# Patient Record
Sex: Male | Born: 1950 | Race: Black or African American | Hispanic: No | Marital: Married | Smoking: Never smoker
Health system: Southern US, Community
[De-identification: ages and names within clinical notes are randomized; demographics above are authoritative.]

## PROBLEM LIST (undated history)

## (undated) DIAGNOSIS — C61 Malignant neoplasm of prostate: Secondary | ICD-10-CM

## (undated) DIAGNOSIS — Z973 Presence of spectacles and contact lenses: Secondary | ICD-10-CM

## (undated) DIAGNOSIS — R351 Nocturia: Secondary | ICD-10-CM

## (undated) DIAGNOSIS — G589 Mononeuropathy, unspecified: Secondary | ICD-10-CM

## (undated) DIAGNOSIS — E079 Disorder of thyroid, unspecified: Secondary | ICD-10-CM

## (undated) DIAGNOSIS — E119 Type 2 diabetes mellitus without complications: Secondary | ICD-10-CM

## (undated) DIAGNOSIS — F419 Anxiety disorder, unspecified: Secondary | ICD-10-CM

## (undated) DIAGNOSIS — E039 Hypothyroidism, unspecified: Secondary | ICD-10-CM

## (undated) DIAGNOSIS — I10 Essential (primary) hypertension: Secondary | ICD-10-CM

## (undated) DIAGNOSIS — E782 Mixed hyperlipidemia: Secondary | ICD-10-CM

## (undated) DIAGNOSIS — F411 Generalized anxiety disorder: Secondary | ICD-10-CM

## (undated) HISTORY — PX: NO PAST SURGERIES: SHX2092

---

## 1999-01-02 ENCOUNTER — Encounter: Payer: Self-pay | Admitting: Emergency Medicine

## 1999-01-02 ENCOUNTER — Inpatient Hospital Stay (HOSPITAL_COMMUNITY): Admission: EM | Admit: 1999-01-02 | Discharge: 1999-01-03 | Payer: Self-pay | Admitting: Emergency Medicine

## 2000-11-29 ENCOUNTER — Ambulatory Visit (HOSPITAL_COMMUNITY): Admission: EM | Admit: 2000-11-29 | Discharge: 2000-11-29 | Payer: Self-pay | Admitting: Emergency Medicine

## 2000-11-29 ENCOUNTER — Encounter: Payer: Self-pay | Admitting: Cardiology

## 2004-07-19 ENCOUNTER — Emergency Department (HOSPITAL_COMMUNITY): Admission: EM | Admit: 2004-07-19 | Discharge: 2004-07-19 | Payer: Self-pay | Admitting: Emergency Medicine

## 2012-05-19 ENCOUNTER — Emergency Department (HOSPITAL_COMMUNITY): Payer: BC Managed Care – PPO

## 2012-05-19 ENCOUNTER — Encounter (HOSPITAL_COMMUNITY): Payer: Self-pay

## 2012-05-19 ENCOUNTER — Emergency Department (HOSPITAL_COMMUNITY)
Admission: EM | Admit: 2012-05-19 | Discharge: 2012-05-19 | Disposition: A | Discharge status: Home / Self Care | Payer: BC Managed Care – PPO | Attending: Emergency Medicine | Admitting: Emergency Medicine

## 2012-05-19 DIAGNOSIS — E079 Disorder of thyroid, unspecified: Secondary | ICD-10-CM | POA: Insufficient documentation

## 2012-05-19 DIAGNOSIS — Z87891 Personal history of nicotine dependence: Secondary | ICD-10-CM | POA: Insufficient documentation

## 2012-05-19 DIAGNOSIS — Z79899 Other long term (current) drug therapy: Secondary | ICD-10-CM | POA: Insufficient documentation

## 2012-05-19 DIAGNOSIS — R42 Dizziness and giddiness: Secondary | ICD-10-CM

## 2012-05-19 DIAGNOSIS — R110 Nausea: Secondary | ICD-10-CM | POA: Insufficient documentation

## 2012-05-19 DIAGNOSIS — F411 Generalized anxiety disorder: Secondary | ICD-10-CM | POA: Insufficient documentation

## 2012-05-19 DIAGNOSIS — I10 Essential (primary) hypertension: Secondary | ICD-10-CM | POA: Insufficient documentation

## 2012-05-19 HISTORY — DX: Disorder of thyroid, unspecified: E07.9

## 2012-05-19 HISTORY — DX: Anxiety disorder, unspecified: F41.9

## 2012-05-19 HISTORY — DX: Essential (primary) hypertension: I10

## 2012-05-19 LAB — CBC
Hemoglobin: 14.7 g/dL (ref 13.0–17.0)
MCH: 29.5 pg (ref 26.0–34.0)
MCV: 86.7 fL (ref 78.0–100.0)
RBC: 4.98 MIL/uL (ref 4.22–5.81)

## 2012-05-19 LAB — COMPREHENSIVE METABOLIC PANEL
ALT: 28 U/L (ref 0–53)
CO2: 27 mEq/L (ref 19–32)
Calcium: 9.8 mg/dL (ref 8.4–10.5)
GFR calc Af Amer: 85 mL/min — ABNORMAL LOW (ref >90)
GFR calc non Af Amer: 74 mL/min — ABNORMAL LOW (ref >90)
Glucose, Bld: 116 mg/dL — ABNORMAL HIGH (ref 70–99)
Sodium: 138 mEq/L (ref 135–145)
Total Bilirubin: 0.3 mg/dL (ref 0.3–1.2)

## 2012-05-19 MED ORDER — ONDANSETRON 4 MG PO TBDP: ORAL_TABLET | ORAL | Status: DC

## 2012-05-19 MED ORDER — MECLIZINE HCL 25 MG PO TABS
25.0000 mg | ORAL_TABLET | Freq: Once | ORAL | Status: AC
  Administered 2012-05-19: 25 mg via ORAL
  Filled 2012-05-19: qty 1

## 2012-05-19 MED ORDER — ONDANSETRON HCL 4 MG/2ML IJ SOLN
4.0000 mg | Freq: Once | INTRAMUSCULAR | Status: AC
  Administered 2012-05-19: 4 mg via INTRAVENOUS
  Filled 2012-05-19: qty 2

## 2012-05-19 MED ORDER — MECLIZINE HCL 25 MG PO TABS: 25.0000 mg | ORAL_TABLET | Freq: Four times a day (QID) | ORAL | Status: DC | PRN

## 2012-05-19 MED ORDER — SODIUM CHLORIDE 0.9 % IV SOLN
Freq: Once | INTRAVENOUS | Status: AC
  Administered 2012-05-19: 18:00:00 via INTRAVENOUS

## 2012-05-19 NOTE — ED Notes (Signed)
Flu-like symptoms for  Began day after New years,.  Pt. Is feeling better today and then began vomiting X4  Pt.  Had an anxiety attack.  And also is dizzy.

## 2012-05-19 NOTE — ED Notes (Signed)
Pt A.O. X 4. Reports mild dizziness with movement. Denies N/V. Denies SOB. Vitals stable. Skin warm and dry. NAD.  Verbalized understanding of medications. Verbalized need to follow up it symptoms don't resolve. Wife at bedside. No further questions at this time.

## 2012-05-19 NOTE — ED Provider Notes (Signed)
History     CSN: [id number]  Arrival date & time 05/19/12  1438   First MD Initiated Contact with Patient 05/19/12 1745      Chief Complaint  Patient presents with  . Influenza    (Consider location/radiation/quality/duration/timing/severity/associated sxs/prior treatment) Patient is a 62 y.o. male presenting with flu symptoms. The history is provided by the patient (pt complains of dizziness when moving head today). No language interpreter was used.  Influenza This is a new problem. The current episode started 3 to 5 hours ago. The problem occurs constantly. The problem has not changed since onset.Pertinent negatives include no chest pain, no abdominal pain and no headaches. Nothing aggravates the symptoms. Nothing relieves the symptoms. He has tried nothing for the symptoms. The treatment provided mild relief.    Past Medical History  Diagnosis Date  . Anxiety   . Hypertension   . Thyroid disease     History reviewed. No pertinent past surgical history.  No family history on file.  History  Substance Use Topics  . Smoking status: Former Games developer  . Smokeless tobacco: Not on file  . Alcohol Use: Yes     Comment: occassional      Review of Systems  Constitutional: Negative for fatigue.  HENT: Negative for congestion, sinus pressure and ear discharge.   Eyes: Negative for discharge.  Respiratory: Negative for cough.   Cardiovascular: Negative for chest pain.  Gastrointestinal: Positive for nausea. Negative for abdominal pain and diarrhea.  Genitourinary: Negative for frequency and hematuria.  Musculoskeletal: Negative for back pain.  Skin: Negative for rash.  Neurological: Positive for dizziness. Negative for seizures and headaches.  Hematological: Negative.   Psychiatric/Behavioral: Negative for hallucinations.    Allergies  Review of patient's allergies indicates no known allergies.  Home Medications   Current Outpatient Rx  Name  Route  Sig  Dispense   Refill  . ACETAMINOPHEN 500 MG PO TABS   Oral   Take 1,000 mg by mouth every 6 (six) hours as needed. For fever         . ALPRAZOLAM ER PO   Oral   Take by mouth.         . B COMPLEX-C PO TABS   Oral   Take 1 tablet by mouth daily.         Marland Kitchen SYNTHROID PO   Oral   Take by mouth.         . DIOVAN PO   Oral   Take by mouth.         Marland Kitchen MECLIZINE HCL 25 MG PO TABS   Oral   Take 1 tablet (25 mg total) by mouth every 6 (six) hours as needed for dizziness.   20 tablet   0   . ONDANSETRON 4 MG PO TBDP      4mg  ODT q6 hours prn nausea   20 tablet   0     BP 146/121  Pulse 76  Temp 98.3 F (36.8 C) (Oral)  Resp 18  SpO2 100%  Physical Exam  Constitutional: He is oriented to person, place, and time. He appears well-developed.  HENT:  Head: Normocephalic and atraumatic.  Eyes: Conjunctivae normal and EOM are normal. No scleral icterus.  Neck: Neck supple. No thyromegaly present.  Cardiovascular: Normal rate and regular rhythm.  Exam reveals no gallop and no friction rub.   No murmur heard. Pulmonary/Chest: No stridor. He has no wheezes. He has no rales. He exhibits no tenderness.  Abdominal:  He exhibits no distension. There is no tenderness. There is no rebound.  Musculoskeletal: Normal range of motion. He exhibits no edema.  Lymphadenopathy:    He has no cervical adenopathy.  Neurological: He is oriented to person, place, and time. Coordination normal.       Vertigo symptoms  Skin: No rash noted. No erythema.  Psychiatric: He has a normal mood and affect. His behavior is normal.    ED Course  Procedures (including critical care time)  Labs Reviewed  COMPREHENSIVE METABOLIC PANEL - Abnormal; Notable for the following:    Glucose, Bld 116 (*)     Alkaline Phosphatase 30 (*)     GFR calc non Af Amer 74 (*)     GFR calc Af Amer 85 (*)     All other components within normal limits  CBC   Ct Head Wo Contrast  05/19/2012  *RADIOLOGY REPORT*  Clinical  Data: Dizziness.  Recent diagnosis of influenza.  CT HEAD WITHOUT CONTRAST  Technique:  Contiguous axial images were obtained from the base of the skull through the vertex without contrast.  Comparison: Unenhanced cranial CT 07/19/2004.  Findings: Asymmetry in the lateral ventricles, right larger than left, is unchanged and felt to be developmental.  Ventricles normal in size.  No mass lesion.  No midline shift.  No acute hemorrhage or hematoma.  No extra-axial fluid collections.  No evidence of acute infarction.  No significant interval change.  Opacification of bilateral ethmoid air cells and the left frontal sinus.  Remaining visualized paranasal sinuses, bilateral mastoid air cells, and bilateral middle ear cavities well-aerated.  IMPRESSION:  1.  Normal intracranially. 2.  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   Original Report Authenticated By: Hulan Saas, M.D.      1. Vertigo       MDM          Benny Lennert, MD 05/19/12 [phone]

## 2014-01-30 DIAGNOSIS — Z79899 Other long term (current) drug therapy: Secondary | ICD-10-CM | POA: Diagnosis not present

## 2014-01-30 DIAGNOSIS — E782 Mixed hyperlipidemia: Secondary | ICD-10-CM | POA: Diagnosis not present

## 2014-01-30 DIAGNOSIS — E039 Hypothyroidism, unspecified: Secondary | ICD-10-CM | POA: Diagnosis not present

## 2014-01-30 DIAGNOSIS — Z0000 Encounter for general adult medical examination without abnormal findings: Secondary | ICD-10-CM | POA: Diagnosis not present

## 2014-01-30 DIAGNOSIS — G479 Sleep disorder, unspecified: Secondary | ICD-10-CM | POA: Diagnosis not present

## 2014-01-30 DIAGNOSIS — Z125 Encounter for screening for malignant neoplasm of prostate: Secondary | ICD-10-CM | POA: Diagnosis not present

## 2014-01-30 DIAGNOSIS — F429 Obsessive-compulsive disorder, unspecified: Secondary | ICD-10-CM | POA: Diagnosis not present

## 2014-01-30 DIAGNOSIS — Z1331 Encounter for screening for depression: Secondary | ICD-10-CM | POA: Diagnosis not present

## 2014-01-30 DIAGNOSIS — N529 Male erectile dysfunction, unspecified: Secondary | ICD-10-CM | POA: Diagnosis not present

## 2014-01-30 DIAGNOSIS — Z23 Encounter for immunization: Secondary | ICD-10-CM | POA: Diagnosis not present

## 2014-01-30 DIAGNOSIS — I10 Essential (primary) hypertension: Secondary | ICD-10-CM | POA: Diagnosis not present

## 2014-06-05 DIAGNOSIS — H16223 Keratoconjunctivitis sicca, not specified as Sjogren's, bilateral: Secondary | ICD-10-CM | POA: Diagnosis not present

## 2014-06-05 DIAGNOSIS — H1712 Central corneal opacity, left eye: Secondary | ICD-10-CM | POA: Diagnosis not present

## 2015-04-17 DIAGNOSIS — F429 Obsessive-compulsive disorder, unspecified: Secondary | ICD-10-CM | POA: Diagnosis not present

## 2015-04-17 DIAGNOSIS — Z23 Encounter for immunization: Secondary | ICD-10-CM | POA: Diagnosis not present

## 2015-04-17 DIAGNOSIS — G4700 Insomnia, unspecified: Secondary | ICD-10-CM | POA: Diagnosis not present

## 2015-04-17 DIAGNOSIS — M79643 Pain in unspecified hand: Secondary | ICD-10-CM | POA: Diagnosis not present

## 2015-04-17 DIAGNOSIS — E039 Hypothyroidism, unspecified: Secondary | ICD-10-CM | POA: Diagnosis not present

## 2015-04-17 DIAGNOSIS — I10 Essential (primary) hypertension: Secondary | ICD-10-CM | POA: Diagnosis not present

## 2015-04-17 DIAGNOSIS — Z0001 Encounter for general adult medical examination with abnormal findings: Secondary | ICD-10-CM | POA: Diagnosis not present

## 2015-04-17 DIAGNOSIS — Z79899 Other long term (current) drug therapy: Secondary | ICD-10-CM | POA: Diagnosis not present

## 2015-04-17 DIAGNOSIS — E559 Vitamin D deficiency, unspecified: Secondary | ICD-10-CM | POA: Diagnosis not present

## 2015-04-17 DIAGNOSIS — E782 Mixed hyperlipidemia: Secondary | ICD-10-CM | POA: Diagnosis not present

## 2015-04-17 DIAGNOSIS — Z125 Encounter for screening for malignant neoplasm of prostate: Secondary | ICD-10-CM | POA: Diagnosis not present

## 2015-04-17 DIAGNOSIS — F329 Major depressive disorder, single episode, unspecified: Secondary | ICD-10-CM | POA: Diagnosis not present

## 2015-08-27 DIAGNOSIS — E039 Hypothyroidism, unspecified: Secondary | ICD-10-CM | POA: Diagnosis not present

## 2016-04-21 DIAGNOSIS — I10 Essential (primary) hypertension: Secondary | ICD-10-CM | POA: Diagnosis not present

## 2016-04-21 DIAGNOSIS — Z125 Encounter for screening for malignant neoplasm of prostate: Secondary | ICD-10-CM | POA: Diagnosis not present

## 2016-04-21 DIAGNOSIS — N529 Male erectile dysfunction, unspecified: Secondary | ICD-10-CM | POA: Diagnosis not present

## 2016-04-21 DIAGNOSIS — Z1389 Encounter for screening for other disorder: Secondary | ICD-10-CM | POA: Diagnosis not present

## 2016-04-21 DIAGNOSIS — Z79899 Other long term (current) drug therapy: Secondary | ICD-10-CM | POA: Diagnosis not present

## 2016-04-21 DIAGNOSIS — E782 Mixed hyperlipidemia: Secondary | ICD-10-CM | POA: Diagnosis not present

## 2016-04-21 DIAGNOSIS — G4700 Insomnia, unspecified: Secondary | ICD-10-CM | POA: Diagnosis not present

## 2016-04-21 DIAGNOSIS — Z0000 Encounter for general adult medical examination without abnormal findings: Secondary | ICD-10-CM | POA: Diagnosis not present

## 2016-04-21 DIAGNOSIS — F419 Anxiety disorder, unspecified: Secondary | ICD-10-CM | POA: Diagnosis not present

## 2016-04-21 DIAGNOSIS — F429 Obsessive-compulsive disorder, unspecified: Secondary | ICD-10-CM | POA: Diagnosis not present

## 2016-04-21 DIAGNOSIS — E559 Vitamin D deficiency, unspecified: Secondary | ICD-10-CM | POA: Diagnosis not present

## 2016-04-21 DIAGNOSIS — Z23 Encounter for immunization: Secondary | ICD-10-CM | POA: Diagnosis not present

## 2016-04-21 DIAGNOSIS — F329 Major depressive disorder, single episode, unspecified: Secondary | ICD-10-CM | POA: Diagnosis not present

## 2016-04-21 DIAGNOSIS — E039 Hypothyroidism, unspecified: Secondary | ICD-10-CM | POA: Diagnosis not present

## 2016-07-23 DIAGNOSIS — E038 Other specified hypothyroidism: Secondary | ICD-10-CM | POA: Diagnosis not present

## 2017-05-16 DIAGNOSIS — F419 Anxiety disorder, unspecified: Secondary | ICD-10-CM | POA: Diagnosis not present

## 2017-05-16 DIAGNOSIS — G4700 Insomnia, unspecified: Secondary | ICD-10-CM | POA: Diagnosis not present

## 2017-05-16 DIAGNOSIS — F429 Obsessive-compulsive disorder, unspecified: Secondary | ICD-10-CM | POA: Diagnosis not present

## 2017-05-16 DIAGNOSIS — E039 Hypothyroidism, unspecified: Secondary | ICD-10-CM | POA: Diagnosis not present

## 2017-05-16 DIAGNOSIS — Z125 Encounter for screening for malignant neoplasm of prostate: Secondary | ICD-10-CM | POA: Diagnosis not present

## 2017-05-16 DIAGNOSIS — I10 Essential (primary) hypertension: Secondary | ICD-10-CM | POA: Diagnosis not present

## 2017-05-16 DIAGNOSIS — E782 Mixed hyperlipidemia: Secondary | ICD-10-CM | POA: Diagnosis not present

## 2017-05-16 DIAGNOSIS — N529 Male erectile dysfunction, unspecified: Secondary | ICD-10-CM | POA: Diagnosis not present

## 2017-05-16 DIAGNOSIS — E559 Vitamin D deficiency, unspecified: Secondary | ICD-10-CM | POA: Diagnosis not present

## 2017-05-16 DIAGNOSIS — Z79899 Other long term (current) drug therapy: Secondary | ICD-10-CM | POA: Diagnosis not present

## 2017-05-16 DIAGNOSIS — Z0001 Encounter for general adult medical examination with abnormal findings: Secondary | ICD-10-CM | POA: Diagnosis not present

## 2017-05-16 DIAGNOSIS — Z1389 Encounter for screening for other disorder: Secondary | ICD-10-CM | POA: Diagnosis not present

## 2017-06-14 DIAGNOSIS — E782 Mixed hyperlipidemia: Secondary | ICD-10-CM | POA: Diagnosis not present

## 2017-06-14 DIAGNOSIS — Z23 Encounter for immunization: Secondary | ICD-10-CM | POA: Diagnosis not present

## 2017-06-14 DIAGNOSIS — I10 Essential (primary) hypertension: Secondary | ICD-10-CM | POA: Diagnosis not present

## 2017-06-14 DIAGNOSIS — Z79899 Other long term (current) drug therapy: Secondary | ICD-10-CM | POA: Diagnosis not present

## 2017-06-14 DIAGNOSIS — E039 Hypothyroidism, unspecified: Secondary | ICD-10-CM | POA: Diagnosis not present

## 2017-07-07 ENCOUNTER — Ambulatory Visit (INDEPENDENT_AMBULATORY_CARE_PROVIDER_SITE_OTHER): Payer: Medicare Other

## 2017-07-07 ENCOUNTER — Ambulatory Visit (INDEPENDENT_AMBULATORY_CARE_PROVIDER_SITE_OTHER): Payer: Medicare Other | Admitting: Podiatry

## 2017-07-07 ENCOUNTER — Other Ambulatory Visit: Payer: Self-pay | Admitting: Podiatry

## 2017-07-07 ENCOUNTER — Encounter: Payer: Self-pay | Admitting: Podiatry

## 2017-07-07 DIAGNOSIS — M779 Enthesopathy, unspecified: Secondary | ICD-10-CM

## 2017-07-07 DIAGNOSIS — L84 Corns and callosities: Secondary | ICD-10-CM

## 2017-07-07 DIAGNOSIS — M79672 Pain in left foot: Secondary | ICD-10-CM | POA: Diagnosis not present

## 2017-07-07 DIAGNOSIS — M79671 Pain in right foot: Secondary | ICD-10-CM | POA: Diagnosis not present

## 2017-07-07 MED ORDER — TRIAMCINOLONE ACETONIDE 10 MG/ML IJ SUSP
10.0000 mg | Freq: Once | INTRAMUSCULAR | Status: AC
  Administered 2017-07-07: 10 mg

## 2017-07-07 NOTE — Progress Notes (Signed)
   Subjective:    Patient ID: Stephen Zavala, male    DOB: Oct 07, 1950, 67 y.o.   MRN: [id number]  HPI    Review of Systems  All other systems reviewed and are negative.      Objective:   Physical Exam        Assessment & Plan:

## 2017-07-07 NOTE — Progress Notes (Signed)
Subjective:   Patient ID: Stephen Zavala, male   DOB: 67 y.o.   MRN: [id number]   HPI Patient presents with chronic lesion of the fifth metatarsal head right that is painful when pressed with moderate lesion of the left fifth metatarsal not as sore with fluid buildup and states is been present for around a year.  Does not remember specific injury and patient does not smoke currently and likes to be active   Review of Systems  All other systems reviewed and are negative.       Objective:  Physical Exam  Constitutional: He appears well-developed and well-nourished.  Cardiovascular: Intact distal pulses.  Pulmonary/Chest: Effort normal.  Musculoskeletal: Normal range of motion.  Neurological: He is alert.  Skin: Skin is warm.  Nursing note and vitals reviewed.   Neurovascular status intact muscle strength adequate range of motion within normal limits with patient found to have inflammation and fluid around the fifth metatarsal head right that is painful when pressed with keratotic lesion formation around the metatarsal head.  Moderate lesion on the fifth metatarsal left foot noted and patient was found to have good digital perfusion and well oriented x3     Assessment:  Inflammatory capsulitis with tailor's bunion deformity and corn callus formation fifth metatarsal right over left     Plan:  H&P x-rays reviewed condition discussed at great length.  I do not recommend surgery but it may be necessary in future and today I did go ahead and I injected the plantar capsule 3 mg dexamethasone Kenalog 5 mg Xylocaine and debrided lesion right and left with no iatrogenic bleeding.  Discussed thick bottom shoes and reappoint when symptomatic  X-rays indicate that there is slight reactivity but no indications of abnormal pressure to the fifth metatarsal

## 2017-07-29 DIAGNOSIS — R0789 Other chest pain: Secondary | ICD-10-CM | POA: Diagnosis not present

## 2018-02-17 ENCOUNTER — Ambulatory Visit (INDEPENDENT_AMBULATORY_CARE_PROVIDER_SITE_OTHER): Payer: Medicare Other | Admitting: Podiatry

## 2018-02-17 ENCOUNTER — Encounter: Payer: Self-pay | Admitting: Podiatry

## 2018-02-17 DIAGNOSIS — L84 Corns and callosities: Secondary | ICD-10-CM

## 2018-02-17 DIAGNOSIS — M779 Enthesopathy, unspecified: Secondary | ICD-10-CM | POA: Diagnosis not present

## 2018-02-17 DIAGNOSIS — M79672 Pain in left foot: Secondary | ICD-10-CM

## 2018-02-17 NOTE — Progress Notes (Signed)
Subjective: 67 year old male presents the office today for concerns of the pain callus to the ball of his right foot on the fifth toe area which is painful and he also gets some pain he points on the top on the fifth MPJ.  He states that her last appointment Dr. Paulla Dolly he was feeling great however the pain started come back.  He has had no treatment since he was last seen with Dr. Paulla Dolly he denies any recent injury or trauma denies any swelling.  He has no other concerns. Denies any systemic complaints such as fevers, chills, nausea, vomiting. No acute changes since last appointment, and no other complaints at this time.   Objective: AAO x3, NAD DP/PT pulses palpable bilaterally, CRT less than 3 seconds Mild tailor's bunion deformities present on the right with tenderness palpation along the fifth MPJ dorsally as well as laterally.  Also hyperkeratotic lesion submetatarsal 5 with tenderness palpation.  Upon debridement there is no underlying ulceration, drainage or any clinical signs of infection noted today.  No open lesions or pre-ulcerative lesions.  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  -Discussed a steroid injection he wished to proceed.  After the skin was prepped in sterile fashion a mixture of dexamethasone phosphate 1 cc as well as 1 cc of lidocaine plain was infiltrated into and around the fifth MPJ without any complications. -Today I sharply debrided the hyperkeratotic lesion without any complications or bleeding. -Offloading pads dispensed for tailor's bunion. -Patient encouraged to call the office with any questions, concerns, change in symptoms.   Trula Slade DPM

## 2018-02-23 ENCOUNTER — Ambulatory Visit: Payer: Medicare Other | Admitting: Podiatry

## 2018-05-31 DIAGNOSIS — F329 Major depressive disorder, single episode, unspecified: Secondary | ICD-10-CM | POA: Diagnosis not present

## 2018-05-31 DIAGNOSIS — I10 Essential (primary) hypertension: Secondary | ICD-10-CM | POA: Diagnosis not present

## 2018-05-31 DIAGNOSIS — Z23 Encounter for immunization: Secondary | ICD-10-CM | POA: Diagnosis not present

## 2018-05-31 DIAGNOSIS — E782 Mixed hyperlipidemia: Secondary | ICD-10-CM | POA: Diagnosis not present

## 2018-05-31 DIAGNOSIS — Z0000 Encounter for general adult medical examination without abnormal findings: Secondary | ICD-10-CM | POA: Diagnosis not present

## 2018-05-31 DIAGNOSIS — L820 Inflamed seborrheic keratosis: Secondary | ICD-10-CM | POA: Diagnosis not present

## 2018-05-31 DIAGNOSIS — Z125 Encounter for screening for malignant neoplasm of prostate: Secondary | ICD-10-CM | POA: Diagnosis not present

## 2018-05-31 DIAGNOSIS — E039 Hypothyroidism, unspecified: Secondary | ICD-10-CM | POA: Diagnosis not present

## 2018-05-31 DIAGNOSIS — Z79899 Other long term (current) drug therapy: Secondary | ICD-10-CM | POA: Diagnosis not present

## 2018-05-31 DIAGNOSIS — Z1389 Encounter for screening for other disorder: Secondary | ICD-10-CM | POA: Diagnosis not present

## 2018-07-21 ENCOUNTER — Ambulatory Visit (INDEPENDENT_AMBULATORY_CARE_PROVIDER_SITE_OTHER): Payer: Medicare Other | Admitting: Podiatry

## 2018-07-21 ENCOUNTER — Encounter: Payer: Self-pay | Admitting: Podiatry

## 2018-07-21 DIAGNOSIS — M779 Enthesopathy, unspecified: Secondary | ICD-10-CM

## 2018-07-21 MED ORDER — TRIAMCINOLONE ACETONIDE 10 MG/ML IJ SUSP
10.0000 mg | Freq: Once | INTRAMUSCULAR | Status: AC
  Administered 2018-07-21: 10 mg

## 2018-07-23 NOTE — Progress Notes (Signed)
Subjective:   Patient ID: Stephen Zavala, male   DOB: 68 y.o.   MRN: [id number]   HPI Patient presents stating he is developed pain in the plantar aspect of the right fifth MPJ and that is sore currently   ROS      Objective:  Physical Exam  Discomfort in the plantar fifth MPJ right with fluid buildup noted of several weeks duration     Assessment:  Inflammatory capsulitis noted fifth MPJ right     Plan:  H&P condition reviewed and today I did sterile prep and injected the plantar capsule 3 mg Dexasone Kenalog 5 mg Xylocaine advised on reduced activity and reappoint to recheck

## 2018-07-31 DIAGNOSIS — E782 Mixed hyperlipidemia: Secondary | ICD-10-CM | POA: Diagnosis not present

## 2019-02-05 ENCOUNTER — Other Ambulatory Visit: Payer: Self-pay

## 2019-02-05 ENCOUNTER — Encounter (HOSPITAL_COMMUNITY): Payer: Self-pay | Admitting: Emergency Medicine

## 2019-02-05 ENCOUNTER — Emergency Department (HOSPITAL_COMMUNITY)
Admission: EM | Admit: 2019-02-05 | Discharge: 2019-02-06 | Disposition: A | Discharge status: Home / Self Care | Payer: Medicare Other | Attending: Emergency Medicine | Admitting: Emergency Medicine

## 2019-02-05 DIAGNOSIS — Z87891 Personal history of nicotine dependence: Secondary | ICD-10-CM | POA: Diagnosis not present

## 2019-02-05 DIAGNOSIS — Z23 Encounter for immunization: Secondary | ICD-10-CM | POA: Insufficient documentation

## 2019-02-05 DIAGNOSIS — Y92003 Bedroom of unspecified non-institutional (private) residence as the place of occurrence of the external cause: Secondary | ICD-10-CM | POA: Insufficient documentation

## 2019-02-05 DIAGNOSIS — W010XXA Fall on same level from slipping, tripping and stumbling without subsequent striking against object, initial encounter: Secondary | ICD-10-CM | POA: Insufficient documentation

## 2019-02-05 DIAGNOSIS — Z79899 Other long term (current) drug therapy: Secondary | ICD-10-CM | POA: Insufficient documentation

## 2019-02-05 DIAGNOSIS — S0231XA Fracture of orbital floor, right side, initial encounter for closed fracture: Secondary | ICD-10-CM | POA: Diagnosis not present

## 2019-02-05 DIAGNOSIS — S0990XA Unspecified injury of head, initial encounter: Secondary | ICD-10-CM | POA: Diagnosis not present

## 2019-02-05 DIAGNOSIS — S0285XA Fracture of orbit, unspecified, initial encounter for closed fracture: Secondary | ICD-10-CM

## 2019-02-05 DIAGNOSIS — I10 Essential (primary) hypertension: Secondary | ICD-10-CM | POA: Diagnosis not present

## 2019-02-05 DIAGNOSIS — W19XXXA Unspecified fall, initial encounter: Secondary | ICD-10-CM

## 2019-02-05 DIAGNOSIS — S0083XA Contusion of other part of head, initial encounter: Secondary | ICD-10-CM

## 2019-02-05 DIAGNOSIS — Y999 Unspecified external cause status: Secondary | ICD-10-CM | POA: Insufficient documentation

## 2019-02-05 DIAGNOSIS — Y9389 Activity, other specified: Secondary | ICD-10-CM | POA: Diagnosis not present

## 2019-02-05 DIAGNOSIS — S01112A Laceration without foreign body of left eyelid and periocular area, initial encounter: Secondary | ICD-10-CM | POA: Diagnosis not present

## 2019-02-05 DIAGNOSIS — S01111A Laceration without foreign body of right eyelid and periocular area, initial encounter: Secondary | ICD-10-CM | POA: Diagnosis not present

## 2019-02-05 DIAGNOSIS — S199XXA Unspecified injury of neck, initial encounter: Secondary | ICD-10-CM | POA: Diagnosis not present

## 2019-02-05 NOTE — ED Triage Notes (Addendum)
Pt reports mechanical fall tonight. Right eye swollen, red, laceration above the right eye. Denies vision changed, A&O x 4, bleeding controlled at this time. Pt denies blood thinner use.

## 2019-02-06 ENCOUNTER — Emergency Department (HOSPITAL_COMMUNITY): Payer: Medicare Other

## 2019-02-06 DIAGNOSIS — S01111A Laceration without foreign body of right eyelid and periocular area, initial encounter: Secondary | ICD-10-CM | POA: Diagnosis not present

## 2019-02-06 DIAGNOSIS — S0231XA Fracture of orbital floor, right side, initial encounter for closed fracture: Secondary | ICD-10-CM | POA: Diagnosis not present

## 2019-02-06 DIAGNOSIS — S199XXA Unspecified injury of neck, initial encounter: Secondary | ICD-10-CM | POA: Diagnosis not present

## 2019-02-06 DIAGNOSIS — S0990XA Unspecified injury of head, initial encounter: Secondary | ICD-10-CM | POA: Diagnosis not present

## 2019-02-06 MED ORDER — TETANUS-DIPHTH-ACELL PERTUSSIS 5-2.5-18.5 LF-MCG/0.5 IM SUSP
0.5000 mL | Freq: Once | INTRAMUSCULAR | Status: AC
  Administered 2019-02-06: 0.5 mL via INTRAMUSCULAR
  Filled 2019-02-06: qty 0.5

## 2019-02-06 MED ORDER — LIDOCAINE HCL (PF) 1 % IJ SOLN
10.0000 mL | Freq: Once | INTRAMUSCULAR | Status: AC
  Administered 2019-02-06: 07:00:00 10 mL
  Filled 2019-02-06: qty 10

## 2019-02-06 MED ORDER — ACETAMINOPHEN 325 MG PO TABS
650.0000 mg | ORAL_TABLET | Freq: Once | ORAL | Status: AC
  Administered 2019-02-06: 650 mg via ORAL
  Filled 2019-02-06: qty 2

## 2019-02-06 MED ORDER — TRAMADOL HCL 50 MG PO TABS: 50.0000 mg | ORAL_TABLET | Freq: Two times a day (BID) | ORAL | 0 refills | Status: DC | PRN

## 2019-02-06 NOTE — ED Notes (Signed)
Patient verbalizes understanding of discharge instructions. Opportunity for questioning and answers were provided. Armband removed by staff, pt discharged from ED.  

## 2019-02-06 NOTE — Discharge Instructions (Addendum)
Your diagnosis today was a orbital p fracture.  Please follow-up with Dr. Trilby Drummer ear nose and throat doctor--within the next 1 to 2 weeks for reassessment.  Please do not allow your forehead laceration to get wet in the next 24 hours.  The sutures will dissolve within 2 to 3 weeks.  You may see a scar form.  Apply A&E ointment or Vaseline to area to keep moist and promote healing.   Keep the laceration site dry for the next 24 hours and leave the dressing in place. After 24 hours you may remove the dressing and gently clean the laceration site with antibacterial soap and warm water. Do not scrub the area. Do not soak the area and water for long periods of time. Scalp laceration generally heal very well with minimal risk of infection, however, you should return sooner for any signs of infection which would include increased redness around the wound, increased swelling, new drainage of yellow pus.

## 2019-02-06 NOTE — ED Provider Notes (Signed)
[facility] EMERGENCY DEPARTMENT Provider Note   CSN: [id number] Arrival date & time: 02/05/19  2004     History   Chief Complaint Chief Complaint  Patient presents with   Fall   Facial Laceration    HPI Stephen Zavala is a 68 y.o. male with history of hypertension and hypothyroidism presents for right forehead laceration after falling and striking his head upon the ground.  Patient states that he was lying in bed and tripped on some saliva while getting up in the process tripped and fell.  Wife of patient at bedside states that patient lost consciousness after striking his head on the ground was unconscious for approximately 60 seconds.   Upon regaining consciousness patient states that he recalls events leading up to and events after losing consciousness.  Patient denies any weakness, trouble swallowing/talking, paresthesias. Patient denies any vision loss as well. Patient states he is able to ambulate and denies dizziness.   Patient is not anticoagulated, no history of prior episodes of syncope.       HPI  Past Medical History:  Diagnosis Date   Anxiety    Hypertension    Thyroid disease     There are no active problems to display for this patient.   History reviewed. No pertinent surgical history.      Home Medications    Prior to Admission medications   Medication Sig Start Date End Date Taking? Authorizing Provider  acetaminophen (TYLENOL) 500 MG tablet Take 1,000 mg by mouth every 6 (six) hours as needed. For fever    [provider]  ALPRAZolam (XANAX) 0.5 MG tablet TK 1 T PO QD PRF ANXIETY 02/14/18   [provider]  ALPRAZOLAM ER PO Take by mouth.    [provider]  B Complex-C (B-COMPLEX WITH VITAMIN C) tablet Take 1 tablet by mouth daily.    [provider]  levothyroxine (SYNTHROID, LEVOTHROID) 150 MCG tablet TK 1 T PO QD 05/15/18   [provider]  Levothyroxine Sodium (SYNTHROID PO)  Take by mouth.    [provider]  meclizine (ANTIVERT) 25 MG tablet Take 1 tablet (25 mg total) by mouth every 6 (six) hours as needed for dizziness. 05/19/12   Milton Ferguson, MD  ondansetron (ZOFRAN ODT) 4 MG disintegrating tablet 4mg  ODT q6 hours prn nausea 05/19/12   Milton Ferguson, MD  rosuvastatin (CRESTOR) 20 MG tablet TK 1 T PO QD 06/26/18   [provider]  Valsartan (DIOVAN PO) Take by mouth.    [provider]  valsartan-hydrochlorothiazide (DIOVAN-HCT) 160-12.5 MG tablet TK 1 T PO QD 05/15/18   [provider]  zolpidem (AMBIEN) 10 MG tablet TK 1 T PO HS PRN 05/16/18   [provider]    Family History No family history on file.  Social History Social History   Tobacco Use   Smoking status: Former Smoker   Smokeless tobacco: Never Used  Substance Use Topics   Alcohol use: Yes    Comment: occassional   Drug use: No     Allergies   Statins   Review of Systems Review of Systems  Constitutional: Negative for chills and fever.  HENT: Negative for congestion.   Eyes: Negative for pain.  Respiratory: Negative for cough and shortness of breath.   Cardiovascular: Negative for chest pain and leg swelling.  Gastrointestinal: Negative for abdominal pain and vomiting.  Genitourinary: Negative for dysuria.  Musculoskeletal: Negative for myalgias.  Skin: Positive for wound. Negative for  rash.  Neurological: Negative for dizziness and headaches.     Physical Exam Updated Vital Signs BP (!) 151/86 (BP Location: Right Arm)    Pulse 63    Temp 98.2 F (36.8 C) (Oral)    Resp 12    SpO2 97%   Physical Exam Vitals signs and nursing note reviewed.  Constitutional:      General: He is not in acute distress. HENT:     Head: Normocephalic and atraumatic.     Right Ear: Tympanic membrane, ear canal and external ear normal.     Left Ear: Tympanic membrane, ear canal and external ear normal.     Ears:     Comments: No  peri-auricular bruising, no hemotympanum     Nose: Nose normal.     Mouth/Throat:     Mouth: Mucous membranes are moist.  Eyes:     General: No scleral icterus.    Extraocular Movements: Extraocular movements intact.     Pupils: Pupils are equal, round, and reactive to light.     Comments: EOMI without pain or entrapment  Right eye with subconjunctival hemorrhage and ecchymosis around his eye   Neck:     Musculoskeletal: Normal range of motion.  Cardiovascular:     Rate and Rhythm: Normal rate and regular rhythm.     Pulses: Normal pulses.     Heart sounds: Normal heart sounds.  Pulmonary:     Effort: Pulmonary effort is normal. No respiratory distress.     Breath sounds: No wheezing.  Abdominal:     Palpations: Abdomen is soft.     Tenderness: There is no abdominal tenderness.  Musculoskeletal: Normal range of motion.     Right lower leg: No edema.     Left lower leg: No edema.  Skin:    General: Skin is warm and dry.     Capillary Refill: Capillary refill takes less than 2 seconds.     Findings: Bruising (right eye bruising and swelling) present.     Comments: Laceration of the right eyebrow approximately 3 cm in length that is superficial, gaping, linear and clean without obvious foreign body on initial inspection   Neurological:     Mental Status: He is alert. Mental status is at baseline.     Comments: Alert and oriented to self, place, time and event.  Speech is fluent, clear without dysarthria or dysphasia. Strength 5/5 in upper/lower extremities  Sensation intact in upper/lower extremities  Normal finger-to-nose and feet tapping.  CN I not tested  CN II grossly intact visual fields bilaterally. Patient is not wearing glasses.  CN III, IV, VI PERRLA and EOMs intact bilaterally  CN V Intact sensation to sharp and light touch to the face  CN VII facial movements symmetric  CN VIII not tested  CN IX, X no uvula deviation, symmetric rise of soft palate  CN XI 5/5 SCM  and trapezius strength bilaterally  CN XII Midline tongue protrusion, symmetric L/R movements   Psychiatric:        Mood and Affect: Mood normal.        Behavior: Behavior normal.        ED Treatments / Results  Labs (all labs ordered are listed, but only abnormal results are displayed) Labs Reviewed - No data to display  EKG None  Radiology Ct Head Wo Contrast  Result Date: 02/06/2019 CLINICAL DATA:  Fall from bed.  Right eye swelling. EXAM: CT HEAD WITHOUT CONTRAST CT MAXILLOFACIAL WITHOUT CONTRAST CT  CERVICAL SPINE WITHOUT CONTRAST TECHNIQUE: Multidetector CT imaging of the head, cervical spine, and maxillofacial structures were performed using the standard protocol without intravenous contrast. Multiplanar CT image reconstructions of the cervical spine and maxillofacial structures were also generated. COMPARISON:  Head CT 05/19/2012 FINDINGS: CT HEAD FINDINGS Brain: No evidence of acute infarction, hemorrhage, hydrocephalus, extra-axial collection or mass lesion/mass effect. Vascular: No hyperdense vessel or unexpected calcification. Skull: Negative for calvarial fracture. CT MAXILLOFACIAL FINDINGS Osseous: Right orbital floor blow-out fracture with trace fat herniation. Symmetric normal size inferior rectus. Orbits: As above.  No evidence of globe injury or hematoma. Sinuses: Minimal right maxillary hemosinus. Soft tissues: Contusion with gas around the right orbit. CT CERVICAL SPINE FINDINGS Alignment: Degenerative reversal of cervical lordosis. No traumatic malalignment. Skull base and vertebrae: No acute fracture. No primary bone lesion or focal pathologic process. Soft tissues and spinal canal: No prevertebral fluid or swelling. No visible canal hematoma. Few areas of soft tissue gas or attributed to degenerative vacuum phenomenon. Disc levels: Generalized disc narrowing and degenerative ridging greatest from C3-4 to C6-7. There is also generalized facet spurring which is milder. Upper  chest: No acute finding. Emphysema and pleural based scarring with calcification. IMPRESSION: 1. Right orbital floor blow-out fracture with trace fat herniation. 2. No evidence of intracranial injury. Negative for cervical spine fracture. Electronically Signed   By: Monte Fantasia M.D.   On: 02/06/2019 05:09   Ct Cervical Spine Wo Contrast  Result Date: 02/06/2019 CLINICAL DATA:  Fall from bed.  Right eye swelling. EXAM: CT HEAD WITHOUT CONTRAST CT MAXILLOFACIAL WITHOUT CONTRAST CT CERVICAL SPINE WITHOUT CONTRAST TECHNIQUE: Multidetector CT imaging of the head, cervical spine, and maxillofacial structures were performed using the standard protocol without intravenous contrast. Multiplanar CT image reconstructions of the cervical spine and maxillofacial structures were also generated. COMPARISON:  Head CT 05/19/2012 FINDINGS: CT HEAD FINDINGS Brain: No evidence of acute infarction, hemorrhage, hydrocephalus, extra-axial collection or mass lesion/mass effect. Vascular: No hyperdense vessel or unexpected calcification. Skull: Negative for calvarial fracture. CT MAXILLOFACIAL FINDINGS Osseous: Right orbital floor blow-out fracture with trace fat herniation. Symmetric normal size inferior rectus. Orbits: As above.  No evidence of globe injury or hematoma. Sinuses: Minimal right maxillary hemosinus. Soft tissues: Contusion with gas around the right orbit. CT CERVICAL SPINE FINDINGS Alignment: Degenerative reversal of cervical lordosis. No traumatic malalignment. Skull base and vertebrae: No acute fracture. No primary bone lesion or focal pathologic process. Soft tissues and spinal canal: No prevertebral fluid or swelling. No visible canal hematoma. Few areas of soft tissue gas or attributed to degenerative vacuum phenomenon. Disc levels: Generalized disc narrowing and degenerative ridging greatest from C3-4 to C6-7. There is also generalized facet spurring which is milder. Upper chest: No acute finding. Emphysema and  pleural based scarring with calcification. IMPRESSION: 1. Right orbital floor blow-out fracture with trace fat herniation. 2. No evidence of intracranial injury. Negative for cervical spine fracture. Electronically Signed   By: Monte Fantasia M.D.   On: 02/06/2019 05:09   Ct Maxillofacial Wo Contrast  Result Date: 02/06/2019 CLINICAL DATA:  Fall from bed.  Right eye swelling. EXAM: CT HEAD WITHOUT CONTRAST CT MAXILLOFACIAL WITHOUT CONTRAST CT CERVICAL SPINE WITHOUT CONTRAST TECHNIQUE: Multidetector CT imaging of the head, cervical spine, and maxillofacial structures were performed using the standard protocol without intravenous contrast. Multiplanar CT image reconstructions of the cervical spine and maxillofacial structures were also generated. COMPARISON:  Head CT 05/19/2012 FINDINGS: CT HEAD FINDINGS Brain: No evidence of acute infarction, hemorrhage,  hydrocephalus, extra-axial collection or mass lesion/mass effect. Vascular: No hyperdense vessel or unexpected calcification. Skull: Negative for calvarial fracture. CT MAXILLOFACIAL FINDINGS Osseous: Right orbital floor blow-out fracture with trace fat herniation. Symmetric normal size inferior rectus. Orbits: As above.  No evidence of globe injury or hematoma. Sinuses: Minimal right maxillary hemosinus. Soft tissues: Contusion with gas around the right orbit. CT CERVICAL SPINE FINDINGS Alignment: Degenerative reversal of cervical lordosis. No traumatic malalignment. Skull base and vertebrae: No acute fracture. No primary bone lesion or focal pathologic process. Soft tissues and spinal canal: No prevertebral fluid or swelling. No visible canal hematoma. Few areas of soft tissue gas or attributed to degenerative vacuum phenomenon. Disc levels: Generalized disc narrowing and degenerative ridging greatest from C3-4 to C6-7. There is also generalized facet spurring which is milder. Upper chest: No acute finding. Emphysema and pleural based scarring with  calcification. IMPRESSION: 1. Right orbital floor blow-out fracture with trace fat herniation. 2. No evidence of intracranial injury. Negative for cervical spine fracture. Electronically Signed   By: Monte Fantasia M.D.   On: 02/06/2019 05:09    Procedures .Marland KitchenLaceration Repair  Date/Time: 02/06/2019 7:54 AM Performed by: Tedd Sias, PA Authorized by: Tedd Sias, PA   Consent:    Consent obtained:  Verbal   Consent given by:  Patient   Risks discussed:  Infection, pain and poor cosmetic result   Alternatives discussed:  No treatment Anesthesia (see MAR for exact dosages):    Anesthesia method:  Local infiltration   Local anesthetic:  Lidocaine 1% WITH epi Laceration details:    Location:  Face   Face location:  R eyebrow   Length (cm):  3   Depth (mm):  0.2 Repair type:    Repair type:  Simple Pre-procedure details:    Preparation:  Patient was prepped and draped in usual sterile fashion Exploration:    Hemostasis achieved with:  Direct pressure   Contaminated: no   Treatment:    Area cleansed with:  Betadine and saline   Amount of cleaning:  Standard   Irrigation solution:  Sterile water   Irrigation volume:  100 ccs   Irrigation method:  Syringe   Visualized foreign bodies/material removed: no   Skin repair:    Repair method:  Sutures and Steri-Strips   Suture size:  5-0   Suture material:  Fast-absorbing gut   Suture technique:  Simple interrupted   Number of sutures:  3   Number of Steri-Strips:  2 Approximation:    Approximation:  Close Post-procedure details:    Dressing:  Antibiotic ointment and non-adherent dressing   Patient tolerance of procedure:  Tolerated well, no immediate complications   (including critical care time)  Medications Ordered in ED Medications - No data to display   Initial Impression / Assessment and Plan / ED Course  I have reviewed the triage vital signs and the nursing notes.  Pertinent labs & imaging results that  were available during my care of the patient were reviewed by me and considered in my medical decision making (see chart for details).       Patient is 68 year old male who fell from standing onto his face earlier today.  Concern for intracranial hemorrhage because of mechanism of injury; patient had CT head, CT maxillofacial and CT cervical spine imaging without contrast.  These images show no intracranial hemorrhage and no cervical spine fracture but revealed a orbital blowout fracture of the right orbital floor with trace fat herniation.  On physical exam patient is able to move eyes in all directions without pain or entrapment.  Patient denies any globe pain and lack of visual symptoms or excessive tearing, or FB sensation makes corneal abrasion unlikely.   Discussed need for follow-up with ENT within 2 weeks.  Discussed avoidance of increasing sinus pressure such as forceful blowing of nose and suppressing sneezes.  Patient understands discharge instructions.   Right eyebrow laceration repaired with dissolvable fast-absorbing gut sutures per patient request as he does not wish to have sutures removed.  Patient understands that this may increase likelihood of scarring.   Patient given tylenol during visit as requested and discharged with instructions to control pain with tylenol and provided with a tramadol prescription in case tylenol is inadequate for pain control.        The patient appears reasonably screened and/or stabilized for discharge and I doubt any other medical condition or other [facility] requiring further screening, evaluation, or treatment in the ED at this time prior to discharge.  Patient is hemodynamically stable, in NAD, and able to ambulate in the ED. Evaluation does not show pathology that would require ongoing emergent intervention or inpatient treatment.  Is been managed with Tylenol during ED visit patient sent with tramadol prescription and states he will use only as  needed if pain worsens. Patient is comfortable with above plan and is stable for discharge at this time. All questions were answered prior to disposition. Results from the ER workup discussed with the patient face to face and all questions answered to the best of my ability. The patient is safe for discharge with strict return precautions.  An After Visit Summary was printed and given to the patient.  Portions of this note were generated with [employer]. Dictation errors may occur despite best attempts at proofreading.   Final Clinical Impressions(s) / ED Diagnoses   Final diagnoses:  None    ED Discharge Orders    None       Tedd Sias, [place] 02/06/19 [id number]    Fatima Blank, MD 02/07/19 [phone]

## 2019-02-13 DIAGNOSIS — S0285XA Fracture of orbit, unspecified, initial encounter for closed fracture: Secondary | ICD-10-CM | POA: Diagnosis not present

## 2019-06-19 DIAGNOSIS — Z1211 Encounter for screening for malignant neoplasm of colon: Secondary | ICD-10-CM | POA: Diagnosis not present

## 2019-06-19 DIAGNOSIS — Z125 Encounter for screening for malignant neoplasm of prostate: Secondary | ICD-10-CM | POA: Diagnosis not present

## 2019-06-19 DIAGNOSIS — E559 Vitamin D deficiency, unspecified: Secondary | ICD-10-CM | POA: Diagnosis not present

## 2019-06-19 DIAGNOSIS — E039 Hypothyroidism, unspecified: Secondary | ICD-10-CM | POA: Diagnosis not present

## 2019-06-19 DIAGNOSIS — Z23 Encounter for immunization: Secondary | ICD-10-CM | POA: Diagnosis not present

## 2019-06-19 DIAGNOSIS — Z1389 Encounter for screening for other disorder: Secondary | ICD-10-CM | POA: Diagnosis not present

## 2019-06-19 DIAGNOSIS — I10 Essential (primary) hypertension: Secondary | ICD-10-CM | POA: Diagnosis not present

## 2019-06-19 DIAGNOSIS — Z0000 Encounter for general adult medical examination without abnormal findings: Secondary | ICD-10-CM | POA: Diagnosis not present

## 2019-06-19 DIAGNOSIS — E782 Mixed hyperlipidemia: Secondary | ICD-10-CM | POA: Diagnosis not present

## 2019-06-19 DIAGNOSIS — Z79899 Other long term (current) drug therapy: Secondary | ICD-10-CM | POA: Diagnosis not present

## 2019-06-19 DIAGNOSIS — Z86010 Personal history of colonic polyps: Secondary | ICD-10-CM | POA: Diagnosis not present

## 2019-07-02 DIAGNOSIS — Z1212 Encounter for screening for malignant neoplasm of rectum: Secondary | ICD-10-CM | POA: Diagnosis not present

## 2019-07-02 DIAGNOSIS — Z1211 Encounter for screening for malignant neoplasm of colon: Secondary | ICD-10-CM | POA: Diagnosis not present

## 2019-08-13 ENCOUNTER — Encounter: Payer: Self-pay | Admitting: Podiatry

## 2019-08-13 ENCOUNTER — Other Ambulatory Visit: Payer: Self-pay

## 2019-08-13 ENCOUNTER — Ambulatory Visit (INDEPENDENT_AMBULATORY_CARE_PROVIDER_SITE_OTHER): Payer: Medicare Other

## 2019-08-13 ENCOUNTER — Ambulatory Visit (INDEPENDENT_AMBULATORY_CARE_PROVIDER_SITE_OTHER): Payer: Medicare Other | Admitting: Podiatry

## 2019-08-13 DIAGNOSIS — M722 Plantar fascial fibromatosis: Secondary | ICD-10-CM

## 2019-08-13 DIAGNOSIS — M778 Other enthesopathies, not elsewhere classified: Secondary | ICD-10-CM

## 2019-08-13 DIAGNOSIS — M779 Enthesopathy, unspecified: Secondary | ICD-10-CM | POA: Diagnosis not present

## 2019-08-15 NOTE — Progress Notes (Signed)
Subjective:   Patient ID: Stephen Zavala, male   DOB: 69 y.o.   MRN: [id number]   HPI Patient presents with pain in the left heel states that it is been sore and that it has been going on for few months and she does not remember specific injury.  No other changes health   ROS      Objective:  Physical Exam  Vascular status intact with patient found to have exquisite discomfort left plantar fascia at the insertion of the tendon into the calcaneus with inflammation and fluid of the medial band     Assessment:  Acute plantar fasciitis left inflammation fluid buildup     Plan:  H&P condition reviewed and today I did sterile prep and injected the fascia 3 mg Kenalog 5 mg Xylocaine applied fascial brace and instructed on supportive shoes physical therapy and reduced activity.  Reappoint to recheck in several weeks  X-ray indicates small spur no indications of stress fracture arthritis

## 2019-08-21 DIAGNOSIS — R972 Elevated prostate specific antigen [PSA]: Secondary | ICD-10-CM | POA: Diagnosis not present

## 2019-08-21 DIAGNOSIS — E782 Mixed hyperlipidemia: Secondary | ICD-10-CM | POA: Diagnosis not present

## 2019-08-21 DIAGNOSIS — I10 Essential (primary) hypertension: Secondary | ICD-10-CM | POA: Diagnosis not present

## 2019-08-21 DIAGNOSIS — N410 Acute prostatitis: Secondary | ICD-10-CM | POA: Diagnosis not present

## 2019-08-21 DIAGNOSIS — E039 Hypothyroidism, unspecified: Secondary | ICD-10-CM | POA: Diagnosis not present

## 2019-09-03 ENCOUNTER — Ambulatory Visit: Payer: Federal, State, Local not specified - PPO | Admitting: Podiatry

## 2019-09-05 ENCOUNTER — Encounter: Payer: Self-pay | Admitting: Podiatry

## 2019-09-05 ENCOUNTER — Ambulatory Visit (INDEPENDENT_AMBULATORY_CARE_PROVIDER_SITE_OTHER): Payer: Medicare Other | Admitting: Podiatry

## 2019-09-05 ENCOUNTER — Other Ambulatory Visit: Payer: Self-pay

## 2019-09-05 DIAGNOSIS — M722 Plantar fascial fibromatosis: Secondary | ICD-10-CM | POA: Diagnosis not present

## 2019-09-05 NOTE — Progress Notes (Signed)
Subjective:   Patient ID: Stephen Zavala, male   DOB: 69 y.o.   MRN: [id number]   HPI Patient states that foot is feeling somewhat better but still having some pain in the heel   ROS      Objective:  Physical Exam  Neurovascular status intact with exquisite discomfort plantar fascial left at the insertional point tendon calcaneus     Assessment:  Plantar fasciitis left improved present     Plan:  Sterile prep reinjected the fascia 3 mg Kenalog 5 mg Xylocaine which was tolerated well

## 2019-10-30 DIAGNOSIS — E782 Mixed hyperlipidemia: Secondary | ICD-10-CM | POA: Diagnosis not present

## 2019-10-30 DIAGNOSIS — G4700 Insomnia, unspecified: Secondary | ICD-10-CM | POA: Diagnosis not present

## 2019-10-30 DIAGNOSIS — I10 Essential (primary) hypertension: Secondary | ICD-10-CM | POA: Diagnosis not present

## 2019-10-30 DIAGNOSIS — E039 Hypothyroidism, unspecified: Secondary | ICD-10-CM | POA: Diagnosis not present

## 2019-10-30 DIAGNOSIS — F329 Major depressive disorder, single episode, unspecified: Secondary | ICD-10-CM | POA: Diagnosis not present

## 2019-12-18 DIAGNOSIS — Z23 Encounter for immunization: Secondary | ICD-10-CM | POA: Diagnosis not present

## 2020-01-15 DIAGNOSIS — E782 Mixed hyperlipidemia: Secondary | ICD-10-CM | POA: Diagnosis not present

## 2020-01-15 DIAGNOSIS — F329 Major depressive disorder, single episode, unspecified: Secondary | ICD-10-CM | POA: Diagnosis not present

## 2020-01-15 DIAGNOSIS — I10 Essential (primary) hypertension: Secondary | ICD-10-CM | POA: Diagnosis not present

## 2020-01-15 DIAGNOSIS — E039 Hypothyroidism, unspecified: Secondary | ICD-10-CM | POA: Diagnosis not present

## 2020-01-15 DIAGNOSIS — G4700 Insomnia, unspecified: Secondary | ICD-10-CM | POA: Diagnosis not present

## 2020-01-30 DIAGNOSIS — G4700 Insomnia, unspecified: Secondary | ICD-10-CM | POA: Diagnosis not present

## 2020-01-30 DIAGNOSIS — E782 Mixed hyperlipidemia: Secondary | ICD-10-CM | POA: Diagnosis not present

## 2020-01-30 DIAGNOSIS — I10 Essential (primary) hypertension: Secondary | ICD-10-CM | POA: Diagnosis not present

## 2020-01-30 DIAGNOSIS — F329 Major depressive disorder, single episode, unspecified: Secondary | ICD-10-CM | POA: Diagnosis not present

## 2020-01-30 DIAGNOSIS — E039 Hypothyroidism, unspecified: Secondary | ICD-10-CM | POA: Diagnosis not present

## 2020-07-02 DIAGNOSIS — F329 Major depressive disorder, single episode, unspecified: Secondary | ICD-10-CM | POA: Diagnosis not present

## 2020-07-02 DIAGNOSIS — I10 Essential (primary) hypertension: Secondary | ICD-10-CM | POA: Diagnosis not present

## 2020-07-02 DIAGNOSIS — E039 Hypothyroidism, unspecified: Secondary | ICD-10-CM | POA: Diagnosis not present

## 2020-07-02 DIAGNOSIS — G4700 Insomnia, unspecified: Secondary | ICD-10-CM | POA: Diagnosis not present

## 2020-07-02 DIAGNOSIS — E782 Mixed hyperlipidemia: Secondary | ICD-10-CM | POA: Diagnosis not present

## 2020-07-16 DIAGNOSIS — G4700 Insomnia, unspecified: Secondary | ICD-10-CM | POA: Diagnosis not present

## 2020-07-16 DIAGNOSIS — F329 Major depressive disorder, single episode, unspecified: Secondary | ICD-10-CM | POA: Diagnosis not present

## 2020-07-16 DIAGNOSIS — E039 Hypothyroidism, unspecified: Secondary | ICD-10-CM | POA: Diagnosis not present

## 2020-07-16 DIAGNOSIS — E782 Mixed hyperlipidemia: Secondary | ICD-10-CM | POA: Diagnosis not present

## 2020-07-16 DIAGNOSIS — I10 Essential (primary) hypertension: Secondary | ICD-10-CM | POA: Diagnosis not present

## 2020-08-21 DIAGNOSIS — I10 Essential (primary) hypertension: Secondary | ICD-10-CM | POA: Diagnosis not present

## 2020-08-21 DIAGNOSIS — Z0000 Encounter for general adult medical examination without abnormal findings: Secondary | ICD-10-CM | POA: Diagnosis not present

## 2020-08-21 DIAGNOSIS — F419 Anxiety disorder, unspecified: Secondary | ICD-10-CM | POA: Diagnosis not present

## 2020-08-21 DIAGNOSIS — Z1389 Encounter for screening for other disorder: Secondary | ICD-10-CM | POA: Diagnosis not present

## 2020-08-21 DIAGNOSIS — R7309 Other abnormal glucose: Secondary | ICD-10-CM | POA: Diagnosis not present

## 2020-08-21 DIAGNOSIS — Z7189 Other specified counseling: Secondary | ICD-10-CM | POA: Diagnosis not present

## 2020-08-21 DIAGNOSIS — E782 Mixed hyperlipidemia: Secondary | ICD-10-CM | POA: Diagnosis not present

## 2020-08-21 DIAGNOSIS — M5432 Sciatica, left side: Secondary | ICD-10-CM | POA: Diagnosis not present

## 2020-08-21 DIAGNOSIS — G4700 Insomnia, unspecified: Secondary | ICD-10-CM | POA: Diagnosis not present

## 2020-08-21 DIAGNOSIS — R739 Hyperglycemia, unspecified: Secondary | ICD-10-CM | POA: Diagnosis not present

## 2020-08-21 DIAGNOSIS — E039 Hypothyroidism, unspecified: Secondary | ICD-10-CM | POA: Diagnosis not present

## 2020-08-21 DIAGNOSIS — E559 Vitamin D deficiency, unspecified: Secondary | ICD-10-CM | POA: Diagnosis not present

## 2020-09-05 DIAGNOSIS — R3129 Other microscopic hematuria: Secondary | ICD-10-CM | POA: Diagnosis not present

## 2020-10-08 DIAGNOSIS — E782 Mixed hyperlipidemia: Secondary | ICD-10-CM | POA: Diagnosis not present

## 2020-10-08 DIAGNOSIS — I10 Essential (primary) hypertension: Secondary | ICD-10-CM | POA: Diagnosis not present

## 2020-10-08 DIAGNOSIS — G4700 Insomnia, unspecified: Secondary | ICD-10-CM | POA: Diagnosis not present

## 2020-10-08 DIAGNOSIS — F329 Major depressive disorder, single episode, unspecified: Secondary | ICD-10-CM | POA: Diagnosis not present

## 2020-10-08 DIAGNOSIS — E039 Hypothyroidism, unspecified: Secondary | ICD-10-CM | POA: Diagnosis not present

## 2020-11-06 DIAGNOSIS — F329 Major depressive disorder, single episode, unspecified: Secondary | ICD-10-CM | POA: Diagnosis not present

## 2020-11-06 DIAGNOSIS — E782 Mixed hyperlipidemia: Secondary | ICD-10-CM | POA: Diagnosis not present

## 2020-11-06 DIAGNOSIS — G4700 Insomnia, unspecified: Secondary | ICD-10-CM | POA: Diagnosis not present

## 2020-11-06 DIAGNOSIS — I10 Essential (primary) hypertension: Secondary | ICD-10-CM | POA: Diagnosis not present

## 2020-11-06 DIAGNOSIS — E039 Hypothyroidism, unspecified: Secondary | ICD-10-CM | POA: Diagnosis not present

## 2020-12-23 DIAGNOSIS — S60450A Superficial foreign body of right index finger, initial encounter: Secondary | ICD-10-CM | POA: Diagnosis not present

## 2021-01-14 DIAGNOSIS — F329 Major depressive disorder, single episode, unspecified: Secondary | ICD-10-CM | POA: Diagnosis not present

## 2021-01-14 DIAGNOSIS — I10 Essential (primary) hypertension: Secondary | ICD-10-CM | POA: Diagnosis not present

## 2021-01-14 DIAGNOSIS — E782 Mixed hyperlipidemia: Secondary | ICD-10-CM | POA: Diagnosis not present

## 2021-01-14 DIAGNOSIS — G4700 Insomnia, unspecified: Secondary | ICD-10-CM | POA: Diagnosis not present

## 2021-01-14 DIAGNOSIS — E039 Hypothyroidism, unspecified: Secondary | ICD-10-CM | POA: Diagnosis not present

## 2021-04-30 DIAGNOSIS — R7303 Prediabetes: Secondary | ICD-10-CM | POA: Diagnosis not present

## 2021-04-30 DIAGNOSIS — E039 Hypothyroidism, unspecified: Secondary | ICD-10-CM | POA: Diagnosis not present

## 2021-04-30 DIAGNOSIS — R350 Frequency of micturition: Secondary | ICD-10-CM | POA: Diagnosis not present

## 2021-04-30 DIAGNOSIS — M791 Myalgia, unspecified site: Secondary | ICD-10-CM | POA: Diagnosis not present

## 2021-06-02 DIAGNOSIS — R972 Elevated prostate specific antigen [PSA]: Secondary | ICD-10-CM | POA: Diagnosis not present

## 2021-06-02 DIAGNOSIS — F411 Generalized anxiety disorder: Secondary | ICD-10-CM | POA: Diagnosis not present

## 2021-06-02 DIAGNOSIS — R7303 Prediabetes: Secondary | ICD-10-CM | POA: Diagnosis not present

## 2021-06-26 DIAGNOSIS — R972 Elevated prostate specific antigen [PSA]: Secondary | ICD-10-CM | POA: Diagnosis not present

## 2021-07-14 DIAGNOSIS — N402 Nodular prostate without lower urinary tract symptoms: Secondary | ICD-10-CM | POA: Diagnosis not present

## 2021-07-14 DIAGNOSIS — R972 Elevated prostate specific antigen [PSA]: Secondary | ICD-10-CM | POA: Diagnosis not present

## 2021-07-17 DIAGNOSIS — C61 Malignant neoplasm of prostate: Secondary | ICD-10-CM | POA: Diagnosis not present

## 2021-07-17 DIAGNOSIS — R972 Elevated prostate specific antigen [PSA]: Secondary | ICD-10-CM | POA: Diagnosis not present

## 2021-07-24 DIAGNOSIS — C61 Malignant neoplasm of prostate: Secondary | ICD-10-CM | POA: Diagnosis not present

## 2021-07-28 ENCOUNTER — Other Ambulatory Visit: Payer: Self-pay | Admitting: Urology

## 2021-07-28 ENCOUNTER — Other Ambulatory Visit (HOSPITAL_COMMUNITY): Payer: Self-pay | Admitting: Urology

## 2021-07-28 DIAGNOSIS — C61 Malignant neoplasm of prostate: Secondary | ICD-10-CM

## 2021-07-30 DIAGNOSIS — R972 Elevated prostate specific antigen [PSA]: Secondary | ICD-10-CM | POA: Diagnosis not present

## 2021-08-05 DIAGNOSIS — K5730 Diverticulosis of large intestine without perforation or abscess without bleeding: Secondary | ICD-10-CM | POA: Diagnosis not present

## 2021-08-05 DIAGNOSIS — C61 Malignant neoplasm of prostate: Secondary | ICD-10-CM | POA: Diagnosis not present

## 2021-08-05 DIAGNOSIS — N400 Enlarged prostate without lower urinary tract symptoms: Secondary | ICD-10-CM | POA: Diagnosis not present

## 2021-08-05 DIAGNOSIS — K769 Liver disease, unspecified: Secondary | ICD-10-CM | POA: Diagnosis not present

## 2021-08-07 ENCOUNTER — Ambulatory Visit (HOSPITAL_COMMUNITY)
Admission: RE | Admit: 2021-08-07 | Discharge: 2021-08-07 | Disposition: A | Discharge status: Home / Self Care | Payer: Medicare Other | Source: Ambulatory Visit | Attending: Urology | Admitting: Urology

## 2021-08-07 ENCOUNTER — Other Ambulatory Visit (HOSPITAL_COMMUNITY): Payer: Federal, State, Local not specified - PPO

## 2021-08-07 ENCOUNTER — Encounter (HOSPITAL_COMMUNITY)
Admission: RE | Admit: 2021-08-07 | Discharge: 2021-08-07 | Disposition: A | Discharge status: Home / Self Care | Payer: Medicare Other | Source: Ambulatory Visit | Attending: Urology | Admitting: Urology

## 2021-08-07 ENCOUNTER — Other Ambulatory Visit: Payer: Self-pay

## 2021-08-07 DIAGNOSIS — C61 Malignant neoplasm of prostate: Secondary | ICD-10-CM | POA: Diagnosis not present

## 2021-08-07 MED ORDER — TECHNETIUM TC 99M MEDRONATE IV KIT
20.7000 | PACK | Freq: Once | INTRAVENOUS | Status: AC | PRN
  Administered 2021-08-07: 20.7 via INTRAVENOUS

## 2021-08-12 ENCOUNTER — Other Ambulatory Visit: Payer: Self-pay | Admitting: Urology

## 2021-08-12 DIAGNOSIS — K769 Liver disease, unspecified: Secondary | ICD-10-CM

## 2021-08-14 DIAGNOSIS — C61 Malignant neoplasm of prostate: Secondary | ICD-10-CM | POA: Diagnosis not present

## 2021-08-17 NOTE — Progress Notes (Signed)
GU Location of Tumor / Histology: Prostate Ca ? ?If Prostate Cancer, Gleason Score is (4 + 3) and PSA is (6.26 07/24/2021) ? ?Biopsies  ?Dr. Claudia Desanctis ? ? ? ? ?Past/Anticipated interventions by urology, if any:  ? ?Past/Anticipated interventions by medical oncology, if any:  ? ?Weight changes, if any: No ? ?IPSS:  4 ?SHIM:  23 ? ?Bowel/Bladder complaints, if any:  No ? ?Nausea/Vomiting, if any: No ? ?Pain issues, if any:  0/10 ? ?SAFETY ISSUES: ?Prior radiation?  Yes, 40-45 years ago Thyroid cancer ?Pacemaker/ICD? No ?Possible current pregnancy? Male ?Is the patient on methotrexate?  No ? ?Current Complaints / other details:   ?

## 2021-08-18 ENCOUNTER — Ambulatory Visit
Admission: RE | Admit: 2021-08-18 | Discharge: 2021-08-18 | Disposition: A | Discharge status: Home / Self Care | Payer: Medicare Other | Source: Ambulatory Visit | Attending: Radiation Oncology | Admitting: Radiation Oncology

## 2021-08-18 ENCOUNTER — Other Ambulatory Visit: Payer: Self-pay

## 2021-08-18 ENCOUNTER — Encounter: Payer: Self-pay | Admitting: Radiation Oncology

## 2021-08-18 ENCOUNTER — Telehealth: Payer: Self-pay | Admitting: *Deleted

## 2021-08-18 VITALS — BP 137/90 | HR 69 | Temp 97.0°F | Resp 18 | Ht 73.0 in | Wt 176.2 lb

## 2021-08-18 DIAGNOSIS — Z7984 Long term (current) use of oral hypoglycemic drugs: Secondary | ICD-10-CM | POA: Insufficient documentation

## 2021-08-18 DIAGNOSIS — Z191 Hormone sensitive malignancy status: Secondary | ICD-10-CM | POA: Diagnosis not present

## 2021-08-18 DIAGNOSIS — Z79899 Other long term (current) drug therapy: Secondary | ICD-10-CM | POA: Diagnosis not present

## 2021-08-18 DIAGNOSIS — I10 Essential (primary) hypertension: Secondary | ICD-10-CM | POA: Diagnosis not present

## 2021-08-18 DIAGNOSIS — Z85850 Personal history of malignant neoplasm of thyroid: Secondary | ICD-10-CM | POA: Insufficient documentation

## 2021-08-18 DIAGNOSIS — C61 Malignant neoplasm of prostate: Secondary | ICD-10-CM | POA: Insufficient documentation

## 2021-08-18 NOTE — Progress Notes (Signed)
?Radiation Oncology         [phone] ?________________________________ ? ?Initial Outpatient Consultation ? ?Name: Stephen Zavala MRN: [id number]  ?Date: 08/18/2021  DOB: 02-06-1951 ? ?PJ:KDTOI, Stephen Baltimore, MD  Robley Fries, MD  ? ?REFERRING PHYSICIAN: Robley Fries, MD ? ?DIAGNOSIS: 71 y.o. 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 ? ?  ICD-10-CM   ?1. Malignant neoplasm of prostate ([place])  C61   ?  ? ? ?HISTORY OF PRESENT ILLNESS: Stephen Zavala is a 71 y.o. male with a diagnosis of prostate cancer. He was noted to have an elevated PSA of 6.26 on routine labs with his primary care physician, Dr. Inda Merlin on 04/30/21.  Prior PSAs had been in the 3s. Accordingly, he was referred for evaluation in urology by Dr. Claudia Desanctis on 07/14/21,  digital rectal examination was performed at that time revealing a 5 mm left apex prostate nodule.  The patient proceeded to transrectal ultrasound with 12 biopsies of the prostate on 07/17/21.  The prostate volume measured 27 cc.  Out of 12 core biopsies, 4 were positive.  The maximum Gleason score was 4+4, and this was seen in the left mid. Additionally, Gleason 4+3 was seen in the left base lateral (with perineural invasion) and left mid lateral, and Gleason 3+4 in the right mid (small focus). ? ?He underwent staging CT A/P on 08/05/21 showing no lymphadenopathy or other findings suspicious for metastatic disease but there were a few indeterminate liver lesions seen so he is scheduled for further evaluation with abdominal MRI on 08/31/21. Staging bone scan on 08/07/21 showed degenerative changes but no definite evidence of metastatic disease. ? ?The patient reviewed the biopsy results with his urologist and he has kindly been referred today for discussion of potential radiation treatment options. ? ? ?PREVIOUS RADIATION THERAPY: Yes ?40-45 years ago for thyroid cancer ? ?PAST MEDICAL HISTORY:  ?Past Medical History:  ?Diagnosis Date   ? Anxiety   ? Hypertension   ? Thyroid disease   ?   ? ?PAST SURGICAL HISTORY:History reviewed. No pertinent surgical history. ? ?FAMILY HISTORY: History reviewed. No pertinent family history. ? ?SOCIAL HISTORY: He lives in [place] with his wife. His daughter is an ER physician in [place]. ?Social History  ? ?Socioeconomic History  ? Marital status: Married  ?  Spouse name: Not on file  ? Number of children: Not on file  ? Years of education: Not on file  ? Highest education level: Not on file  ?Occupational History  ? Not on file  ?Tobacco Use  ? Smoking status: Former  ? Smokeless tobacco: Never  ?Substance and Sexual Activity  ? Alcohol use: Yes  ?  Comment: occassional  ? Drug use: No  ? Sexual activity: Not on file  ?Other Topics Concern  ? Not on file  ?Social History Narrative  ? Not on file  ? ?Social Determinants of Health  ? ?Financial Resource Strain: Not on file  ?Food Insecurity: Not on file  ?Transportation Needs: Not on file  ?Physical Activity: Not on file  ?Stress: Not on file  ?Social Connections: Not on file  ?Intimate Partner Violence: Not on file  ? ? ?ALLERGIES: Statins ? ?MEDICATIONS:  ?Current Outpatient Medications  ?Medication Sig Dispense Refill  ? acetaminophen (TYLENOL) 500 MG tablet Take 1,000 mg by mouth every 6 (six) hours as needed. For fever    ? ALPRAZolam (XANAX) 0.5 MG tablet TK 1 T PO QD  PRF ANXIETY  1  ? B Complex-C (B-COMPLEX WITH VITAMIN C) tablet Take 1 tablet by mouth daily.    ? BINAXNOW COVID-19 AG HOME TEST KIT TEST AS DIRECTED TODAY    ? levothyroxine (SYNTHROID, LEVOTHROID) 150 MCG tablet TK 1 T PO QD    ? metFORMIN (GLUCOPHAGE) 500 MG tablet Take 250 mg by mouth daily.    ? ONETOUCH VERIO test strip daily.    ? rosuvastatin (CRESTOR) 20 MG tablet TK 1 T PO QD    ? valsartan-hydrochlorothiazide (DIOVAN-HCT) 160-12.5 MG tablet TK 1 T PO QD    ? ?No current facility-administered medications for this encounter.  ? ? ?REVIEW OF SYSTEMS:  On review of systems, the  patient reports that he is doing well overall. He denies any chest pain, shortness of breath, cough, fevers, chills, night sweats, unintended weight changes. He denies any bowel disturbances, and denies abdominal pain, nausea or vomiting. He denies any new musculoskeletal or joint aches or pains. His IPSS was 4, indicating mild urinary symptoms. His SHIM was 23, indicating he does not have erectile dysfunction. A complete review of systems is obtained and is otherwise negative. ? ?  ?PHYSICAL EXAM:  ?Wt Readings from Last 3 Encounters:  ?08/18/21 176 lb 4 oz (79.9 kg)  ? ?Temp Readings from Last 3 Encounters:  ?08/18/21 (!) 97 ?F (36.1 ?C) (Temporal)  ?02/05/19 98.2 ?F (36.8 ?C) (Oral)  ?05/19/12 98.3 ?F (36.8 ?C) (Oral)  ? ?BP Readings from Last 3 Encounters:  ?08/18/21 137/90  ?02/06/19 (!) 145/93  ?05/19/12 140/88  ? ?Pulse Readings from Last 3 Encounters:  ?08/18/21 69  ?02/06/19 67  ?05/19/12 98  ? ?Pain Assessment ?Pain Score: 0-No pain/10 ? ?In general this is a well appearing African American male in no acute distress. He's alert and oriented x4 and appropriate throughout the examination. Cardiopulmonary assessment is negative for acute distress, and he exhibits normal effort.   ? ? ?KPS = 100 ? ?100 - Normal; no complaints; no evidence of disease. ?90   - Able to carry on normal activity; minor signs or symptoms of disease. ?80   - Normal activity with effort; some signs or symptoms of disease. ?72   - Cares for self; unable to carry on normal activity or to do active work. ?60   - Requires occasional assistance, but is able to care for most of his personal needs. ?50   - Requires considerable assistance and frequent medical care. ?16   - Disabled; requires special care and assistance. ?30   - Severely disabled; hospital admission is indicated although death not imminent. ?20   - Very sick; hospital admission necessary; active supportive treatment necessary. ?10   - Moribund; fatal processes progressing  rapidly. ?0     - Dead ? ?Karnofsky DA, Abelmann WH, Craver LS and Burchenal [facility] [phone]) The use of the nitrogen mustards in the palliative treatment of carcinoma: with particular reference to bronchogenic carcinoma Cancer 1 634-56 ? ?LABORATORY DATA:  ?Lab Results  ?Component Value Date  ? WBC 8.4 05/19/2012  ? HGB 14.7 05/19/2012  ? HCT 43.2 05/19/2012  ? MCV 86.7 05/19/2012  ? PLT 234 05/19/2012  ? ?Lab Results  ?Component Value Date  ? NA 138 05/19/2012  ? K 4.0 05/19/2012  ? CL 100 05/19/2012  ? CO2 27 05/19/2012  ? ?Lab Results  ?Component Value Date  ? ALT 28 05/19/2012  ? AST 36 05/19/2012  ? ALKPHOS 30 (L) 05/19/2012  ? BILITOT  0.3 05/19/2012  ? ?  ?RADIOGRAPHY: NM Bone Scan Whole Body ? ?Result Date: 08/10/2021 ?CLINICAL DATA:  Prostate carcinoma EXAM: NUCLEAR MEDICINE WHOLE BODY BONE SCAN TECHNIQUE: Whole body anterior and posterior images were obtained approximately 3 hours after intravenous injection of radiopharmaceutical. RADIOPHARMACEUTICALS:  mCi Technetium-23mMDP IV COMPARISON:  None available. FINDINGS: There is focal increased tracer uptake in the area of right AC joint. There is focal tracer uptake in the right big toe. No definite focal increased tracer uptake is seen in the axial skeleton. IMPRESSION: There are no definite imaging signs of metastatic disease. Small focal uptake in the right AC joint may be due to previous trauma or degenerative change. Small focal uptake in the area of right first metatarsophalangeal joint in the big toe may be due to previous injury or degenerative arthritis. Electronically Signed   By: PElmer PickerM.D.   On: 08/10/2021 16:01   ?   ?IMPRESSION/PLAN: ?1. 71y.o. gentleman with Stage T2a adenocarcinoma of the prostate with Gleason Score of 4+4, and PSA of 6.26. ?We discussed the patient's workup and outlined the nature of prostate cancer in this setting. The patient's T stage, Gleason's score, and PSA put him into the high risk group. Accordingly, he is  eligible for a variety of potential treatment options including prostatectomy or LT-ADT in combination with either 8 weeks of external radiation or 5 weeks of external radiation with an upfront brachytherapy

## 2021-08-18 NOTE — Telephone Encounter (Signed)
CALLED PATIENT TO INFORM OF ADT FOR 08-25-21- ARRIVAL TIME- 3:30 PM @ DR. PACE'S OFFICE @ [facility], LVM FOR A RETURN CALL ?

## 2021-08-25 DIAGNOSIS — Z5111 Encounter for antineoplastic chemotherapy: Secondary | ICD-10-CM | POA: Diagnosis not present

## 2021-08-25 DIAGNOSIS — C61 Malignant neoplasm of prostate: Secondary | ICD-10-CM | POA: Diagnosis not present

## 2021-08-31 ENCOUNTER — Ambulatory Visit
Admission: RE | Admit: 2021-08-31 | Discharge: 2021-08-31 | Disposition: A | Discharge status: Home / Self Care | Payer: Medicare Other | Source: Ambulatory Visit | Attending: Urology | Admitting: Urology

## 2021-08-31 DIAGNOSIS — N281 Cyst of kidney, acquired: Secondary | ICD-10-CM | POA: Diagnosis not present

## 2021-08-31 DIAGNOSIS — K769 Liver disease, unspecified: Secondary | ICD-10-CM

## 2021-08-31 DIAGNOSIS — K7689 Other specified diseases of liver: Secondary | ICD-10-CM | POA: Diagnosis not present

## 2021-08-31 DIAGNOSIS — I700 Atherosclerosis of aorta: Secondary | ICD-10-CM | POA: Diagnosis not present

## 2021-08-31 DIAGNOSIS — D1803 Hemangioma of intra-abdominal structures: Secondary | ICD-10-CM | POA: Diagnosis not present

## 2021-08-31 MED ORDER — GADOBENATE DIMEGLUMINE 529 MG/ML IV SOLN
17.0000 mL | Freq: Once | INTRAVENOUS | Status: AC | PRN
  Administered 2021-08-31: 17 mL via INTRAVENOUS

## 2021-09-03 DIAGNOSIS — F329 Major depressive disorder, single episode, unspecified: Secondary | ICD-10-CM | POA: Diagnosis not present

## 2021-09-03 DIAGNOSIS — R7309 Other abnormal glucose: Secondary | ICD-10-CM | POA: Diagnosis not present

## 2021-09-03 DIAGNOSIS — E559 Vitamin D deficiency, unspecified: Secondary | ICD-10-CM | POA: Diagnosis not present

## 2021-09-03 DIAGNOSIS — C61 Malignant neoplasm of prostate: Secondary | ICD-10-CM | POA: Diagnosis not present

## 2021-09-03 DIAGNOSIS — E782 Mixed hyperlipidemia: Secondary | ICD-10-CM | POA: Diagnosis not present

## 2021-09-03 DIAGNOSIS — E039 Hypothyroidism, unspecified: Secondary | ICD-10-CM | POA: Diagnosis not present

## 2021-09-03 DIAGNOSIS — G4700 Insomnia, unspecified: Secondary | ICD-10-CM | POA: Diagnosis not present

## 2021-09-03 DIAGNOSIS — I10 Essential (primary) hypertension: Secondary | ICD-10-CM | POA: Diagnosis not present

## 2021-09-03 DIAGNOSIS — Z79899 Other long term (current) drug therapy: Secondary | ICD-10-CM | POA: Diagnosis not present

## 2021-09-03 DIAGNOSIS — F322 Major depressive disorder, single episode, severe without psychotic features: Secondary | ICD-10-CM | POA: Diagnosis not present

## 2021-09-03 DIAGNOSIS — R7303 Prediabetes: Secondary | ICD-10-CM | POA: Diagnosis not present

## 2021-09-03 DIAGNOSIS — F419 Anxiety disorder, unspecified: Secondary | ICD-10-CM | POA: Diagnosis not present

## 2021-09-03 DIAGNOSIS — Z0000 Encounter for general adult medical examination without abnormal findings: Secondary | ICD-10-CM | POA: Diagnosis not present

## 2021-09-03 DIAGNOSIS — M5432 Sciatica, left side: Secondary | ICD-10-CM | POA: Diagnosis not present

## 2021-09-03 DIAGNOSIS — Z1211 Encounter for screening for malignant neoplasm of colon: Secondary | ICD-10-CM | POA: Diagnosis not present

## 2021-09-14 ENCOUNTER — Telehealth: Payer: Self-pay | Admitting: *Deleted

## 2021-09-14 ENCOUNTER — Other Ambulatory Visit: Payer: Self-pay | Admitting: Urology

## 2021-09-14 NOTE — Telephone Encounter (Signed)
RETURNED PATIENT'S PHONE CALL, LVM FOR A RETURN CALL 

## 2021-09-30 ENCOUNTER — Telehealth: Payer: Self-pay | Admitting: *Deleted

## 2021-09-30 NOTE — Telephone Encounter (Signed)
CALLED PATIENT TO REMIND OF PRE-SEED APPTS. FOR 10-01-21, LVM FOR A RETURN CALL ?

## 2021-10-01 ENCOUNTER — Other Ambulatory Visit: Payer: Self-pay

## 2021-10-01 ENCOUNTER — Encounter (HOSPITAL_COMMUNITY)
Admission: RE | Admit: 2021-10-01 | Discharge: 2021-10-01 | Disposition: A | Discharge status: Home / Self Care | Payer: Medicare Other | Source: Ambulatory Visit | Attending: Urology | Admitting: Urology

## 2021-10-01 ENCOUNTER — Ambulatory Visit
Admission: RE | Admit: 2021-10-01 | Discharge: 2021-10-01 | Disposition: A | Discharge status: Home / Self Care | Payer: Medicare Other | Source: Ambulatory Visit | Attending: Radiation Oncology | Admitting: Radiation Oncology

## 2021-10-01 ENCOUNTER — Encounter: Payer: Self-pay | Admitting: Urology

## 2021-10-01 ENCOUNTER — Ambulatory Visit
Admission: RE | Admit: 2021-10-01 | Discharge: 2021-10-01 | Disposition: A | Discharge status: Home / Self Care | Payer: Medicare Other | Source: Ambulatory Visit | Attending: Urology | Admitting: Urology

## 2021-10-01 VITALS — Resp 20 | Ht 73.0 in | Wt 178.0 lb

## 2021-10-01 DIAGNOSIS — Z01810 Encounter for preprocedural cardiovascular examination: Secondary | ICD-10-CM | POA: Insufficient documentation

## 2021-10-01 DIAGNOSIS — Z191 Hormone sensitive malignancy status: Secondary | ICD-10-CM | POA: Diagnosis not present

## 2021-10-01 DIAGNOSIS — I440 Atrioventricular block, first degree: Secondary | ICD-10-CM

## 2021-10-01 DIAGNOSIS — C61 Malignant neoplasm of prostate: Secondary | ICD-10-CM | POA: Diagnosis not present

## 2021-10-01 DIAGNOSIS — Z01818 Encounter for other preprocedural examination: Secondary | ICD-10-CM

## 2021-10-01 HISTORY — DX: Atrioventricular block, first degree: I44.0

## 2021-10-01 NOTE — Progress Notes (Signed)
  Radiation Oncology         [phone] ________________________________  Name: Stephen Zavala MRN: [id number]  Date: 10/01/2021  DOB: [birth year]  SIMULATION AND TREATMENT PLANNING NOTE PUBIC ARCH STUDY  JK:KXFGH, [name], MD  [name], MD  DIAGNOSIS: 71 y.o. 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  Oncology History  Malignant neoplasm of prostate ([place])  07/17/2021 Cancer Staging   Staging form: Prostate, AJCC 8th Edition - Clinical stage from 07/17/2021: Stage IIC (cT2a, cN0, cM0, PSA: 6.3, Grade Group: 4) - Signed by Freeman Caldron, PA-C on 08/18/2021 Histopathologic type: Adenocarcinoma, NOS Stage prefix: Initial diagnosis Prostate specific antigen (PSA) range: Less than 10 Gleason primary pattern: 4 Gleason secondary pattern: 4 Gleason score: 8 Histologic grading system: 5 grade system Number of biopsy cores examined: 12 Number of biopsy cores positive: 4 Location of positive needle core biopsies: Both sides    08/18/2021 Initial Diagnosis   Malignant neoplasm of prostate ([place])        ICD-10-CM   1. Malignant neoplasm of prostate ([place])  C61       COMPLEX SIMULATION:  The patient presented today for evaluation for possible prostate seed implant. He was brought to the radiation planning suite and placed supine on the CT couch. A 3-dimensional image study set was obtained in upload to the planning computer. There, on each axial slice, I contoured the prostate gland. Then, using three-dimensional radiation planning tools I reconstructed the prostate in view of the structures from the transperineal needle pathway to assess for possible pubic arch interference. In doing so, I did not appreciate any pubic arch interference. Also, the patient's prostate volume was estimated based on the drawn structure. The volume was 22 cc.  Given the pubic arch appearance and prostate volume, patient remains a good candidate to proceed  with prostate seed implant. Today, he freely provided informed written consent to proceed.    PLAN: The patient will undergo prostate seed implant boost followed by IMRT   ________________________________  Sheral Apley. Tammi Klippel, M.D.

## 2021-10-01 NOTE — Progress Notes (Signed)
Pre-Seed appointment. I verified patient identity and began nursing interview. Patient states "Doing well." No issues reported at this time.  Meaningful use complete. No urinary management medications. Urology appointment- June 5th & 13th, 2023  Resp 20   Ht '6\' 1"'$  (1.854 m)   Wt 178 lb (80.7 kg)   BMI 23.48 kg/m

## 2021-10-16 ENCOUNTER — Encounter (HOSPITAL_BASED_OUTPATIENT_CLINIC_OR_DEPARTMENT_OTHER): Payer: Self-pay | Admitting: Urology

## 2021-10-19 ENCOUNTER — Encounter (HOSPITAL_BASED_OUTPATIENT_CLINIC_OR_DEPARTMENT_OTHER): Payer: Self-pay | Admitting: Urology

## 2021-10-19 DIAGNOSIS — R351 Nocturia: Secondary | ICD-10-CM | POA: Diagnosis not present

## 2021-10-19 DIAGNOSIS — C61 Malignant neoplasm of prostate: Secondary | ICD-10-CM | POA: Diagnosis not present

## 2021-10-19 NOTE — Progress Notes (Signed)
Spoke w/ via phone for pre-op interview--- pt Lab needs dos----  [employer] results------ current ekg in epic/ chart COVID test -----patient states asymptomatic no test needed Arrive at ------- 1100 on 10-27-2021 NPO after MN NO Solid Food.  Clear liquids from MN until--- 1000 Med rec completed Medications to take morning of surgery ----- crestor, synthroid Diabetic medication ----- do not take metformin morning of surgery Patient instructed no nail polish to be worn day of surgery Patient instructed to bring photo id and insurance card day of surgery Patient aware to have Driver (ride ) / caregiver for 24 hours after surgery --wife, ida Patient Special Instructions ----- will do one fleet enema morning of surgery Pre-Op special Istructions ----- n/a Patient verbalized understanding of instructions that were given at this phone interview. Patient denies shortness of breath, chest pain, fever, cough at this phone interview.

## 2021-10-26 ENCOUNTER — Telehealth: Payer: Self-pay | Admitting: *Deleted

## 2021-10-26 NOTE — H&P (Signed)
CC/HPI: cc: Prostate cancer    08/25/2021:71 year old man referred for an elevated PSA of 6.26 found have a palpable left prostate nodule. He underwent prostate biopsy on 07/17/2021 and biopsy results are noted below. He underwent staging imaging which did not show any concern for metastatic disease. There was indeterminate lesions in the liver for which an MRI is scheduled for next week to evaluate. He met with Dr. Tammi Klippel with radiation oncology and has decided to proceed with radiation. He is here today for his first ADT injection.   pre-biopsy PSA 6.26  Gleason 4+4=8 in 1/12 cores left mid  Gleason 4+3=7 in 2/12 cores left base and left mid  Gleason 3+4=7 in 1/12 cores right mid  66yrCSS 85%  PFP 45%, 30% at 5, 10 yrs  OC: 37%  ECE 61%  LNI 17%  SVI 13%   10/19/2021: MRI of the abdomen performed after last office visit was reassuring, benign. Patient also received 655-monthligard at that visit. Here today for preoperative appointment prior to undergoing radioactive seed implant as well as SpaceOAR on 6/13. He will then begin 5 weeks of XRT for treatment of his underlying prostate cancer diagnosis. Overall doing well. He has had some mild increase in fatigue and having some occasional hot flashes since beginning ADT but these are not limiting. He is staying active, trying to exercise on a routine basis. Tolerating a normal diet without nausea or or vomiting. He is having normal daily bowel movements. At baseline he does have some mild irritative and obstructive voiding symptoms but typically feels like he empties his bladder well without limiting or bothersome daytime frequency/urgency. Nocturia stable x2. He has had no interval dysuria or gross hematuria. No interval treatment for UTI or other infectious process. Patient denies any recent fever/chills, nausea/vomiting, chest pain or shortness of breath.     ALLERGIES: Statin Drugs    MEDICATIONS: Metformin Hcl  Alprazolam 0.5 mg tablet   Levothyroxine 150 mcg capsule  Rosuvastatin Calcium 20 mg tablet  Valsartan-Hydrochlorothiazide 160 mg-12.5 mg tablet  Vitamin B12  Vitamin D2 50 mcg (2,000 unit) tablet     GU PSH: Locm 300-399Mg/Ml Iodine,1Ml - 08/05/2021 Prostate Needle Biopsy - 07/17/2021     NON-GU PSH: Surgical Pathology, Gross And Microscopic Examination For Prostate Needle - 07/17/2021     GU PMH: Prostate Cancer - 08/25/2021, - 08/05/2021, - 07/24/2021 Elevated PSA - 07/17/2021, - 07/14/2021 Prostate nodule w/o LUTS - 07/17/2021, - 07/14/2021      PMH Notes: thyroid disease   NON-GU PMH: Hypercholesterolemia Hypertension    FAMILY HISTORY: 1 Daughter - Daughter 1 son - Son   SOCIAL HISTORY: Marital Status: Married Preferred Language: English; Ethnicity: Not Hispanic Or Latino; Race: Black or African American Current Smoking Status: Patient has never smoked.   Tobacco Use Assessment Completed: Used Tobacco in last 30 days? Social Drinker.  Drinks 1 caffeinated drink per day.    REVIEW OF SYSTEMS:    GU Review Male:   Patient reports get up at night to urinate. Patient denies frequent urination, hard to postpone urination, burning/ pain with urination, leakage of urine, stream starts and stops, trouble starting your stream, have to strain to urinate , erection problems, and penile pain.  Gastrointestinal (Upper):   Patient denies vomiting, nausea, and indigestion/ heartburn.  Gastrointestinal (Lower):   Patient denies diarrhea and constipation.  Constitutional:   Patient reports night sweats and fatigue. Patient denies fever and weight loss.  Skin:   Patient denies skin rash/  lesion and itching.  Eyes:   Patient denies blurred vision and double vision.  Ears/ Nose/ Throat:   Patient denies sore throat and sinus problems.  Hematologic/Lymphatic:   Patient denies swollen glands and easy bruising.  Cardiovascular:   Patient denies leg swelling and chest pains.  Respiratory:   Patient denies cough and  shortness of breath.  Endocrine:   Patient denies excessive thirst.  Musculoskeletal:   Patient denies back pain and joint pain.  Neurological:   Patient denies headaches and dizziness.  Psychologic:   Patient denies depression and anxiety.   VITAL SIGNS:      10/19/2021 10:56 AM  BP 146/83 mmHg  Heart Rate 62 /min  Temperature 98.2 F / 36.7 C   MULTI-SYSTEM PHYSICAL EXAMINATION:    Constitutional: Well-nourished. No physical deformities. Normally developed. Good grooming.  Neck: Neck symmetrical, not swollen. Normal tracheal position.  Respiratory: No labored breathing, no use of accessory muscles.   Cardiovascular: Normal temperature, normal extremity pulses, no swelling, no varicosities.  Skin: No paleness, no jaundice, no cyanosis. No lesion, no ulcer, no rash.  Neurologic / Psychiatric: Oriented to time, oriented to place, oriented to person. No depression, no anxiety, no agitation.  Gastrointestinal: No mass, no tenderness, no rigidity, non obese abdomen.  Musculoskeletal: Normal gait and station of head and neck.     Complexity of Data:  Source Of History:  Patient, Family/Caregiver, Medical Record Summary  Lab Test Review:   PSA  Records Review:   Pathology Reports, Previous Doctor Records, Previous Hospital Records, Previous Patient Records  Urine Test Review:   Urinalysis   10/19/21  Urinalysis  Urine Appearance Clear   Urine Color Yellow   Urine Glucose Neg mg/dL  Urine Bilirubin Neg mg/dL  Urine Ketones Neg mg/dL  Urine Specific Gravity 1.010   Urine Blood Neg ery/uL  Urine pH 6.0   Urine Protein Neg mg/dL  Urine Urobilinogen 0.2 mg/dL  Urine Nitrites Neg   Urine Leukocyte Esterase Neg leu/uL   PROCEDURES:          Urinalysis Dipstick Dipstick Cont'd  Color: Yellow Bilirubin: Neg mg/dL  Appearance: Clear Ketones: Neg mg/dL  Specific Gravity: 1.010 Blood: Neg ery/uL  pH: 6.0 Protein: Neg mg/dL  Glucose: Neg mg/dL Urobilinogen: 0.2 mg/dL    Nitrites: Neg     Leukocyte Esterase: Neg leu/uL    ASSESSMENT:      ICD-10 Details  1 GU:   Prostate Cancer - C61 Chronic, Stable  2   Nocturia - R35.1 Chronic, Stable   PLAN:           Orders Labs Urine Culture          Schedule Return Visit/Planned Activity: Keep Scheduled Appointment - Follow up MD, Schedule Surgery          Document Letter(s):  Created for Patient: Clinical Summary         Notes:   All questions answered to the best of my ability regarding the upcoming procedure and expected postoperative course with understanding expressed by the patient and his wife. Urine culture sent today to serve as a baseline. He will proceed with radioactive seed implant and SpaceOAR insertion on 6/13. This will be followed by 5 weeks of XRT a couple of weeks later under the guidance of radiation oncology.

## 2021-10-26 NOTE — Progress Notes (Signed)
Spoke with patient by phone and he has cold symptoms x 2 days, low grade fever and sore throat on 10-25-2021 and home covid test was negative on 10-25-2021, pt call transferred to [facility] to reschedule surgery for 10-27-2021.

## 2021-10-26 NOTE — Telephone Encounter (Signed)
CALLED PATIENT TO REMIND OF PROCEDURE FOR 10-27-21, SPOKE WITH PATIENT AND HE IS AWARE OF THIS PROCEDURE

## 2021-10-27 ENCOUNTER — Ambulatory Visit (HOSPITAL_BASED_OUTPATIENT_CLINIC_OR_DEPARTMENT_OTHER): Admission: RE | Admit: 2021-10-27 | Payer: Medicare Other | Source: Ambulatory Visit | Admitting: Urology

## 2021-10-27 DIAGNOSIS — Z01818 Encounter for other preprocedural examination: Secondary | ICD-10-CM

## 2021-10-27 HISTORY — DX: Presence of spectacles and contact lenses: Z97.3

## 2021-10-27 HISTORY — DX: Malignant neoplasm of prostate: C61

## 2021-10-27 HISTORY — DX: Mixed hyperlipidemia: E78.2

## 2021-10-27 HISTORY — DX: Type 2 diabetes mellitus without complications: E11.9

## 2021-10-27 HISTORY — DX: Nocturia: R35.1

## 2021-10-27 HISTORY — DX: Hypothyroidism, unspecified: E03.9

## 2021-10-27 HISTORY — DX: Generalized anxiety disorder: F41.1

## 2021-10-27 HISTORY — DX: Mononeuropathy, unspecified: G58.9

## 2021-10-27 SURGERY — INSERTION, RADIATION SOURCE, PROSTATE
Anesthesia: Choice

## 2021-10-27 NOTE — Progress Notes (Signed)
RN reviewed status for upcoming brachytherapy for 6/13.  Procedure was cancelled.  RN spoke with patient, patient had to cancel due to flu like symptoms.    RN notified MD's and will help coordinate for patient to get rescheduled.   Patient verbalized understanding and thankfulness.

## 2021-10-29 DIAGNOSIS — M5412 Radiculopathy, cervical region: Secondary | ICD-10-CM | POA: Diagnosis not present

## 2021-10-29 DIAGNOSIS — E039 Hypothyroidism, unspecified: Secondary | ICD-10-CM | POA: Diagnosis not present

## 2021-10-29 DIAGNOSIS — G4700 Insomnia, unspecified: Secondary | ICD-10-CM | POA: Diagnosis not present

## 2021-11-19 ENCOUNTER — Other Ambulatory Visit: Payer: Self-pay

## 2021-11-19 ENCOUNTER — Encounter (HOSPITAL_BASED_OUTPATIENT_CLINIC_OR_DEPARTMENT_OTHER): Payer: Self-pay | Admitting: Urology

## 2021-11-19 NOTE — Progress Notes (Signed)
Spoke w/ via phone for pre-op interview--- Yoe---- CBG, ISTAT               Lab results------ COVID test -----patient states asymptomatic no test needed Arrive at ------- 1200  NPO after MN NO Solid Food.  Clear liquids from MN until--- 1100 am Med rec completed Medications to take morning of surgery ----- Xanax, Synthroid, Crestor Diabetic medication ----- Hold Metformin Patient instructed no nail polish to be worn day of surgery N/A Patient instructed to bring photo id and insurance card day of surgery Yes Patient aware to have Driver (ride ) / caregiver for 24 hours after surgery Yes wife Arek Spadafore [phone] (cell) Patient Special Instructions ----- Pre-Op special Istructions ----- Fleets enema day of surgery Patient verbalized understanding of instructions that were given at this phone interview. Yes Patient denies shortness of breath, chest pain, fever, cough at this phone interview. Yes

## 2021-11-30 ENCOUNTER — Telehealth: Payer: Self-pay | Admitting: *Deleted

## 2021-11-30 NOTE — Telephone Encounter (Signed)
CALLED PATIENT TO REMIND OF PROCEDURE FOR 12/01/21, LVM FOR A RETURN CALL

## 2021-11-30 NOTE — Telephone Encounter (Signed)
RETURNED PATIENT'S PHONE CALL, SPOKE WITH PATIENT. ?

## 2021-12-01 ENCOUNTER — Encounter (HOSPITAL_BASED_OUTPATIENT_CLINIC_OR_DEPARTMENT_OTHER): Payer: Self-pay | Admitting: Urology

## 2021-12-01 ENCOUNTER — Ambulatory Visit (HOSPITAL_COMMUNITY): Payer: Medicare Other

## 2021-12-01 ENCOUNTER — Encounter (HOSPITAL_BASED_OUTPATIENT_CLINIC_OR_DEPARTMENT_OTHER)
Admission: RE | Disposition: A | Discharge status: Home / Self Care | Payer: Self-pay | Source: Home / Self Care | Attending: Urology

## 2021-12-01 ENCOUNTER — Other Ambulatory Visit: Payer: Self-pay

## 2021-12-01 ENCOUNTER — Ambulatory Visit (HOSPITAL_BASED_OUTPATIENT_CLINIC_OR_DEPARTMENT_OTHER): Payer: Medicare Other | Admitting: Anesthesiology

## 2021-12-01 ENCOUNTER — Ambulatory Visit (HOSPITAL_BASED_OUTPATIENT_CLINIC_OR_DEPARTMENT_OTHER)
Admission: RE | Admit: 2021-12-01 | Discharge: 2021-12-01 | Disposition: A | Discharge status: Home / Self Care | Payer: Medicare Other | Attending: Urology | Admitting: Urology

## 2021-12-01 DIAGNOSIS — C61 Malignant neoplasm of prostate: Secondary | ICD-10-CM

## 2021-12-01 DIAGNOSIS — F419 Anxiety disorder, unspecified: Secondary | ICD-10-CM | POA: Insufficient documentation

## 2021-12-01 DIAGNOSIS — E119 Type 2 diabetes mellitus without complications: Secondary | ICD-10-CM | POA: Insufficient documentation

## 2021-12-01 DIAGNOSIS — Z7984 Long term (current) use of oral hypoglycemic drugs: Secondary | ICD-10-CM | POA: Diagnosis not present

## 2021-12-01 DIAGNOSIS — E039 Hypothyroidism, unspecified: Secondary | ICD-10-CM | POA: Diagnosis not present

## 2021-12-01 DIAGNOSIS — I10 Essential (primary) hypertension: Secondary | ICD-10-CM | POA: Insufficient documentation

## 2021-12-01 DIAGNOSIS — Z191 Hormone sensitive malignancy status: Secondary | ICD-10-CM | POA: Diagnosis not present

## 2021-12-01 HISTORY — PX: SPACE OAR INSTILLATION: SHX6769

## 2021-12-01 HISTORY — PX: RADIOACTIVE SEED IMPLANT: SHX5150

## 2021-12-01 HISTORY — PX: CYSTOSCOPY: SHX5120

## 2021-12-01 LAB — GLUCOSE, CAPILLARY: Glucose-Capillary: 76 mg/dL (ref 70–99)

## 2021-12-01 LAB — POCT I-STAT, CHEM 8
BUN: 11 mg/dL (ref 8–23)
Calcium, Ion: 1.31 mmol/L (ref 1.15–1.40)
Chloride: 102 mmol/L (ref 98–111)
Creatinine, Ser: 1.1 mg/dL (ref 0.61–1.24)
Glucose, Bld: 113 mg/dL — ABNORMAL HIGH (ref 70–99)
HCT: 41 % (ref 39.0–52.0)
Hemoglobin: 13.9 g/dL (ref 13.0–17.0)
Potassium: 3.9 mmol/L (ref 3.5–5.1)
Sodium: 141 mmol/L (ref 135–145)
TCO2: 27 mmol/L (ref 22–32)

## 2021-12-01 SURGERY — INSERTION, RADIATION SOURCE, PROSTATE
Anesthesia: General | Site: Prostate

## 2021-12-01 MED ORDER — CEFAZOLIN SODIUM-DEXTROSE 2-4 GM/100ML-% IV SOLN
INTRAVENOUS | Status: AC
  Filled 2021-12-01: qty 100

## 2021-12-01 MED ORDER — PHENYLEPHRINE 80 MCG/ML (10ML) SYRINGE FOR IV PUSH (FOR BLOOD PRESSURE SUPPORT)
PREFILLED_SYRINGE | INTRAVENOUS | Status: DC | PRN
  Administered 2021-12-01: 160 ug via INTRAVENOUS
  Administered 2021-12-01 (×3): 80 ug via INTRAVENOUS

## 2021-12-01 MED ORDER — GLYCOPYRROLATE PF 0.2 MG/ML IJ SOSY
PREFILLED_SYRINGE | INTRAMUSCULAR | Status: AC
  Filled 2021-12-01: qty 1

## 2021-12-01 MED ORDER — STERILE WATER FOR IRRIGATION IR SOLN
Status: DC | PRN
  Administered 2021-12-01: 3 mL

## 2021-12-01 MED ORDER — PROPOFOL 10 MG/ML IV BOLUS
INTRAVENOUS | Status: DC | PRN
  Administered 2021-12-01: 40 mg via INTRAVENOUS
  Administered 2021-12-01: 150 mg via INTRAVENOUS

## 2021-12-01 MED ORDER — ACETAMINOPHEN 500 MG PO TABS
1000.0000 mg | ORAL_TABLET | Freq: Once | ORAL | Status: AC
  Administered 2021-12-01: 1000 mg via ORAL

## 2021-12-01 MED ORDER — DEXAMETHASONE SODIUM PHOSPHATE 10 MG/ML IJ SOLN
INTRAMUSCULAR | Status: DC | PRN
  Administered 2021-12-01: 5 mg via INTRAVENOUS

## 2021-12-01 MED ORDER — TRAMADOL HCL 50 MG PO TABS: 50.0000 mg | ORAL_TABLET | Freq: Four times a day (QID) | ORAL | 0 refills | Status: DC | PRN

## 2021-12-01 MED ORDER — ONDANSETRON HCL 4 MG/2ML IJ SOLN
INTRAMUSCULAR | Status: AC
  Filled 2021-12-01: qty 2

## 2021-12-01 MED ORDER — FENTANYL CITRATE (PF) 100 MCG/2ML IJ SOLN
INTRAMUSCULAR | Status: AC
  Filled 2021-12-01: qty 2

## 2021-12-01 MED ORDER — PHENYLEPHRINE 80 MCG/ML (10ML) SYRINGE FOR IV PUSH (FOR BLOOD PRESSURE SUPPORT)
PREFILLED_SYRINGE | INTRAVENOUS | Status: AC
  Filled 2021-12-01: qty 10

## 2021-12-01 MED ORDER — FENTANYL CITRATE (PF) 100 MCG/2ML IJ SOLN: 25.0000 ug | INTRAMUSCULAR | Status: DC | PRN

## 2021-12-01 MED ORDER — EPHEDRINE SULFATE-NACL 50-0.9 MG/10ML-% IV SOSY
PREFILLED_SYRINGE | INTRAVENOUS | Status: DC | PRN
  Administered 2021-12-01: 5 mg via INTRAVENOUS

## 2021-12-01 MED ORDER — AMISULPRIDE (ANTIEMETIC) 5 MG/2ML IV SOLN: 10.0000 mg | Freq: Once | INTRAVENOUS | Status: DC | PRN

## 2021-12-01 MED ORDER — ONDANSETRON HCL 4 MG/2ML IJ SOLN: 4.0000 mg | Freq: Once | INTRAMUSCULAR | Status: DC | PRN

## 2021-12-01 MED ORDER — PROPOFOL 10 MG/ML IV BOLUS
INTRAVENOUS | Status: AC
  Filled 2021-12-01: qty 20

## 2021-12-01 MED ORDER — LACTATED RINGERS IV SOLN: INTRAVENOUS | Status: DC

## 2021-12-01 MED ORDER — CEFAZOLIN SODIUM-DEXTROSE 2-3 GM-%(50ML) IV SOLR
INTRAVENOUS | Status: DC | PRN
  Administered 2021-12-01: 2 g via INTRAVENOUS

## 2021-12-01 MED ORDER — ROCURONIUM BROMIDE 10 MG/ML (PF) SYRINGE
PREFILLED_SYRINGE | INTRAVENOUS | Status: AC
  Filled 2021-12-01: qty 10

## 2021-12-01 MED ORDER — SODIUM CHLORIDE (PF) 0.9 % IJ SOLN
INTRAMUSCULAR | Status: DC | PRN
  Administered 2021-12-01: 10 mL

## 2021-12-01 MED ORDER — GLYCOPYRROLATE 0.2 MG/ML IJ SOLN
INTRAMUSCULAR | Status: DC | PRN
  Administered 2021-12-01: .2 mg via INTRAVENOUS

## 2021-12-01 MED ORDER — FENTANYL CITRATE (PF) 100 MCG/2ML IJ SOLN
INTRAMUSCULAR | Status: DC | PRN
  Administered 2021-12-01: 100 ug via INTRAVENOUS

## 2021-12-01 MED ORDER — LIDOCAINE HCL (PF) 2 % IJ SOLN
INTRAMUSCULAR | Status: AC
  Filled 2021-12-01: qty 5

## 2021-12-01 MED ORDER — DEXAMETHASONE SODIUM PHOSPHATE 10 MG/ML IJ SOLN
INTRAMUSCULAR | Status: AC
  Filled 2021-12-01: qty 1

## 2021-12-01 MED ORDER — OXYCODONE HCL 5 MG/5ML PO SOLN: 5.0000 mg | Freq: Once | ORAL | Status: AC | PRN

## 2021-12-01 MED ORDER — ROCURONIUM BROMIDE 10 MG/ML (PF) SYRINGE
PREFILLED_SYRINGE | INTRAVENOUS | Status: DC | PRN
  Administered 2021-12-01: 5 mg via INTRAVENOUS
  Administered 2021-12-01: 80 mg via INTRAVENOUS

## 2021-12-01 MED ORDER — EPHEDRINE 5 MG/ML INJ
INTRAVENOUS | Status: AC
  Filled 2021-12-01: qty 5

## 2021-12-01 MED ORDER — MIDAZOLAM HCL 2 MG/2ML IJ SOLN
INTRAMUSCULAR | Status: AC
  Filled 2021-12-01: qty 2

## 2021-12-01 MED ORDER — SUGAMMADEX SODIUM 200 MG/2ML IV SOLN
INTRAVENOUS | Status: DC | PRN
  Administered 2021-12-01 (×2): 100 mg via INTRAVENOUS

## 2021-12-01 MED ORDER — OXYCODONE HCL 5 MG PO TABS
ORAL_TABLET | ORAL | Status: AC
  Filled 2021-12-01: qty 1

## 2021-12-01 MED ORDER — ONDANSETRON HCL 4 MG/2ML IJ SOLN
INTRAMUSCULAR | Status: DC | PRN
  Administered 2021-12-01: 4 mg via INTRAVENOUS

## 2021-12-01 MED ORDER — SODIUM CHLORIDE 0.9 % IR SOLN
Status: DC | PRN
  Administered 2021-12-01: 250 mL

## 2021-12-01 MED ORDER — IOHEXOL 300 MG/ML  SOLN
INTRAMUSCULAR | Status: DC | PRN
  Administered 2021-12-01: 7 mL

## 2021-12-01 MED ORDER — LIDOCAINE 2% (20 MG/ML) 5 ML SYRINGE
INTRAMUSCULAR | Status: DC | PRN
  Administered 2021-12-01 (×2): 100 mg via INTRAVENOUS

## 2021-12-01 MED ORDER — MIDAZOLAM HCL 5 MG/5ML IJ SOLN
INTRAMUSCULAR | Status: DC | PRN
  Administered 2021-12-01: 2 mg via INTRAVENOUS

## 2021-12-01 MED ORDER — ACETAMINOPHEN 500 MG PO TABS
ORAL_TABLET | ORAL | Status: AC
  Filled 2021-12-01: qty 2

## 2021-12-01 MED ORDER — OXYCODONE HCL 5 MG PO TABS
5.0000 mg | ORAL_TABLET | Freq: Once | ORAL | Status: AC | PRN
  Administered 2021-12-01: 5 mg via ORAL

## 2021-12-01 SURGICAL SUPPLY — 48 items
BAG DRN RND TRDRP ANRFLXCHMBR (UROLOGICAL SUPPLIES) ×2
BAG URINE DRAIN 2000ML AR STRL (UROLOGICAL SUPPLIES) ×3 IMPLANT
BLADE CLIPPER SENSICLIP SURGIC (BLADE) ×3 IMPLANT
Bard QuickLink Cartridges with BrachySource I-125 ×58 IMPLANT
CATH FOLEY 2WAY SLVR  5CC 16FR (CATHETERS) ×3
CATH FOLEY 2WAY SLVR 5CC 16FR (CATHETERS) ×2 IMPLANT
CATH ROBINSON RED A/P 16FR (CATHETERS) IMPLANT
CATH ROBINSON RED A/P 20FR (CATHETERS) ×3 IMPLANT
CLOTH BEACON ORANGE TIMEOUT ST (SAFETY) ×3 IMPLANT
CNTNR URN SCR LID CUP LEK RST (MISCELLANEOUS) ×2 IMPLANT
CONT SPEC 4OZ STRL OR WHT (MISCELLANEOUS) ×3
COVER BACK TABLE 60X90IN (DRAPES) ×3 IMPLANT
COVER MAYO STAND STRL (DRAPES) ×3 IMPLANT
DRSG TEGADERM 4X4.75 (GAUZE/BANDAGES/DRESSINGS) ×3 IMPLANT
DRSG TEGADERM 8X12 (GAUZE/BANDAGES/DRESSINGS) ×3 IMPLANT
GAUZE SPONGE 4X4 12PLY STRL (GAUZE/BANDAGES/DRESSINGS) ×1 IMPLANT
GEL ULTRASOUND 20GR AQUASONIC (MISCELLANEOUS) ×3 IMPLANT
GLOVE BIO SURGEON STRL SZ 6.5 (GLOVE) ×9 IMPLANT
GLOVE BIO SURGEON STRL SZ7.5 (GLOVE) IMPLANT
GLOVE BIO SURGEON STRL SZ8 (GLOVE) ×6 IMPLANT
GLOVE BIOGEL PI IND STRL 6.5 (GLOVE) IMPLANT
GLOVE BIOGEL PI INDICATOR 6.5 (GLOVE)
GLOVE INDICATOR 6.5 STRL GRN (GLOVE) ×3 IMPLANT
GLOVE SURG ORTHO 8.5 STRL (GLOVE) IMPLANT
GLOVE SURG SS PI 6.5 STRL IVOR (GLOVE) IMPLANT
GOWN STRL REUS W/TWL LRG LVL3 (GOWN DISPOSABLE) ×3 IMPLANT
GRID BRACH TEMP 18GA 2.8X3X.75 (MISCELLANEOUS) ×3 IMPLANT
HOLDER FOLEY CATH W/STRAP (MISCELLANEOUS) ×3 IMPLANT
IMPL SPACEOAR VUE SYSTEM (Spacer) ×2 IMPLANT
IMPLANT SPACEOAR VUE SYSTEM (Spacer) ×3 IMPLANT
IV NS 1000ML (IV SOLUTION) ×3
IV NS 1000ML BAXH (IV SOLUTION) ×2 IMPLANT
KIT TURNOVER CYSTO (KITS) ×3 IMPLANT
NDL BRACHY 18G 5PK (NEEDLE) ×8 IMPLANT
NDL BRACHY 18G SINGLE (NEEDLE) IMPLANT
NDL PK MORGANSTERN STABILIZ (NEEDLE) ×2 IMPLANT
NEEDLE BRACHY 18G 5PK (NEEDLE) ×12 IMPLANT
NEEDLE BRACHY 18G SINGLE (NEEDLE) IMPLANT
NEEDLE PK MORGANSTERN STABILIZ (NEEDLE) ×3 IMPLANT
PACK CYSTO (CUSTOM PROCEDURE TRAY) ×3 IMPLANT
SHEATH ULTRASOUND LF (SHEATH) IMPLANT
SHEATH ULTRASOUND LTX NONSTRL (SHEATH) IMPLANT
SUT BONE WAX W31G (SUTURE) IMPLANT
SYR 10ML LL (SYRINGE) ×3 IMPLANT
SYR CONTROL 10ML LL (SYRINGE) ×3 IMPLANT
TOWEL OR 17X26 10 PK STRL BLUE (TOWEL DISPOSABLE) ×3 IMPLANT
UNDERPAD 30X36 HEAVY ABSORB (UNDERPADS AND DIAPERS) ×6 IMPLANT
WATER STERILE IRR 500ML POUR (IV SOLUTION) ×3 IMPLANT

## 2021-12-01 NOTE — Progress Notes (Signed)
Radiation Oncology         [phone] ________________________________  Name: Tayten Bergdoll MRN: [id number]  Date: 12/01/2021  DOB: 01-Sep-1950       Prostate Seed Implant  BM:WUXLK, [name], MD  No ref. provider found  DIAGNOSIS:  71 y.o. 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  Oncology History  Malignant neoplasm of prostate ([place])  07/17/2021 Cancer Staging   Staging form: Prostate, AJCC 8th Edition - Clinical stage from 07/17/2021: Stage IIC (cT2a, cN0, cM0, PSA: 6.3, Grade Group: 4) - Signed by Freeman Caldron, PA-C on 08/18/2021 Histopathologic type: Adenocarcinoma, NOS Stage prefix: Initial diagnosis Prostate specific antigen (PSA) range: Less than 10 Gleason primary pattern: 4 Gleason secondary pattern: 4 Gleason score: 8 Histologic grading system: 5 grade system Number of biopsy cores examined: 12 Number of biopsy cores positive: 4 Location of positive needle core biopsies: Both sides   08/18/2021 Initial Diagnosis   Malignant neoplasm of prostate ([place])       ICD-10-CM   1. Type 2 diabetes mellitus without complication, without long-term current use of insulin (HCC)  E11.9 CBG per Guidelines for Diabetes Management for Patients Undergoing Surgery ([facility], [facility], and [facility] only)    CBG per protocol    I-Stat, Chem 8 on day of surgery per protocol    CBG per Guidelines for Diabetes Management for Patients Undergoing Surgery ([facility], [facility], and [facility] only)    CBG per protocol    I-Stat, Chem 8 on day of surgery per protocol      PROCEDURE: Insertion of radioactive I-125 seeds into the prostate gland.  RADIATION DOSE: 110 Gy, boost therapy.  TECHNIQUE: Jamorian Dimaria was brought to the operating room with the urologist. He was placed in the dorsolithotomy position. He was catheterized and a rectal tube was inserted. The perineum was shaved, prepped and draped. The ultrasound probe was then introduced into the rectum to see the  prostate gland.  TREATMENT DEVICE: A needle grid was attached to the ultrasound probe stand and anchor needles were placed.  3D PLANNING: The prostate was imaged in 3D using a sagittal sweep of the prostate probe. These images were transferred to the planning computer. There, the prostate, urethra and rectum were defined on each axial reconstructed image. Then, the software created an optimized 3D plan and a few seed positions were adjusted. The quality of the plan was reviewed using [facility] information for the target and the following two organs at risk:  Urethra and Rectum.  Then the accepted plan was printed and handed off to the radiation therapist.  Under my supervision, the custom loading of the seeds and spacers was carried out and loaded into sealed vicryl sleeves.  These pre-loaded needles were then placed into the needle holder.Marland Kitchen  PROSTATE VOLUME STUDY:  Using transrectal ultrasound the volume of the prostate was verified to be 19.5 cc.  SPECIAL TREATMENT PROCEDURE/SUPERVISION AND HANDLING: The pre-loaded needles were then delivered under sagittal guidance. A total of 18 needles were used to deposit 58 seeds in the prostate gland. The individual seed activity was 0.230 mCi.  SpaceOAR:  Yes  COMPLEX SIMULATION: At the end of the procedure, an anterior radiograph of the pelvis was obtained to document seed positioning and count. Cystoscopy was performed to check the urethra and bladder.  MICRODOSIMETRY: At the end of the procedure, the patient was emitting 0.05 mR/hr at 1 meter. Accordingly, he was considered safe for hospital discharge.  PLAN: The patient will return to the radiation oncology clinic for post implant CT dosimetry in three weeks.   ________________________________  Sheral Apley Tammi Klippel, M.D.

## 2021-12-01 NOTE — Progress Notes (Signed)
Addendum: -consent has been updated to reflect Space OAR

## 2021-12-01 NOTE — Transfer of Care (Signed)
Immediate Anesthesia Transfer of Care Note  Patient: Stephen Zavala  Procedure(s) Performed: RADIOACTIVE SEED IMPLANT/BRACHYTHERAPY IMPLANT (Prostate) SPACE OAR INSTILLATION (Prostate) CYSTOSCOPY FLEXIBLE (Bladder)  Patient Location: PACU  Anesthesia Type:General  Level of Consciousness: alert , patient cooperative and responds to stimulation  Airway & Oxygen Therapy: Patient Spontanous Breathing and Patient connected to nasal cannula oxygen  Post-op Assessment: Report given to RN and Post -op Vital signs reviewed and stable  Post vital signs: Reviewed and stable  Last Vitals:  Vitals Value Taken Time  BP 160/90 12/01/21 1554  Temp    Pulse 79 12/01/21 1555  Resp 20 12/01/21 1558  SpO2 98 % 12/01/21 1555  Vitals shown include unvalidated device data.  Last Pain:  Vitals:   12/01/21 1248  TempSrc: Oral  PainSc: 2          Complications: No notable events documented.

## 2021-12-01 NOTE — Anesthesia Postprocedure Evaluation (Signed)
Anesthesia Post Note  Patient: Dance movement psychotherapist  Procedure(s) Performed: RADIOACTIVE SEED IMPLANT/BRACHYTHERAPY IMPLANT (Prostate) SPACE OAR INSTILLATION (Prostate) CYSTOSCOPY FLEXIBLE (Bladder)     Patient location during evaluation: PACU Anesthesia Type: General Level of consciousness: awake and alert Pain management: pain level controlled Vital Signs Assessment: post-procedure vital signs reviewed and stable Respiratory status: spontaneous breathing, nonlabored ventilation and respiratory function stable Cardiovascular status: blood pressure returned to baseline and stable Postop Assessment: no apparent nausea or vomiting Anesthetic complications: no   No notable events documented.  Last Vitals:  Vitals:   12/01/21 1615 12/01/21 1618  BP: (!) 167/104   Pulse: 63 (!) 58  Resp: 17 14  Temp:    SpO2: 100% 100%    Last Pain:  Vitals:   12/01/21 1618  TempSrc:   PainSc: 0-No pain                 Lidia Collum

## 2021-12-01 NOTE — Op Note (Signed)
PATIENT:  Stephen Zavala  PRE-OPERATIVE DIAGNOSIS:  Adenocarcinoma of the prostate  POST-OPERATIVE DIAGNOSIS:  Same  PROCEDURE:  1. I-125 radioactive seed implantation 2. Cystoscopy  3. Placement of SpaceOAR 4. Fluoroscopy use with time less than 1 hour.  SURGEON: Dr. Jacalyn Lefevre  Radiation oncologist: Dr. [name]  ANESTHESIA:  General  EBL:  Minimal  DRAINS: None  INDICATION: Stephen Zavala  Description of procedure: After informed consent the patient was brought to the major OR, placed on the table and administered general anesthesia. He was then moved to the modified lithotomy position with his perineum perpendicular to the floor. His perineum and genitalia were then sterilely prepped. An official timeout was then performed. A 16 French Foley catheter was then placed in the bladder and filled with dilute contrast, a rectal tube was placed in the rectum and the transrectal ultrasound probe was placed in the rectum and affixed to the stand. He was then sterilely draped.  Real time ultrasonography was used along with the seed planning software [employer] Prostate vs. 4.2.2.4. This was used to develop the seed plan including the number of needles as well as number of seeds required for complete and adequate coverage.  The needles were then preloaded with seeds and spacers according to the previously developed plan.  Real-time ultrasonography was then used along with the previously developed plan to implant a total of 58 seeds using 18 needles. This proceeded without difficulty or complication.   I then proceeded with placement of SpaceOAR by introducing a needle with the bevel angled inferiorly approximately 2 cm superior to the anus. This was angled downward and under direct ultrasound was placed within the space between the prostatic capsule and rectum. This was confirmed with a small amount of sterile saline injected and this was performed under direct ultrasound. I then  attached the SpaceOAR to the needle and injected this in the space between the prostate and rectum with good placement noted.  A Foley catheter was then removed as well as the transrectal ultrasound probe and rectal probe. Flexible cystoscopy was then performed using the 17 French flexible scope which revealed a normal urethra throughout its length down to the sphincter which appeared intact. The prostatic urethra revealed bilobar hypertrophy but no evidence of obstruction, seeds, spacers or lesions. The bladder was then entered and fully and systematically inspected. The ureteral orifices were noted to be of normal configuration and position. The mucosa revealed no evidence of tumors. There were also no stones identified within the bladder. I noted no seeds or spacers on the floor of the bladder and retroflexion of the scope revealed no seeds protruding from the base of the prostate.  C-arm fluoroscopy was then used to evaluate the distribution of seeds placement and aid in determining confirmation of the number of seeds placed.  Real-time fluoroscopy was used with saved images revealing the location of the seeds placed both in AP and oblique views.  The cystoscope was then removed and the patient was awakened and taken to recovery room in stable and satisfactory condition. He tolerated procedure well and there were no intraoperative complications.

## 2021-12-01 NOTE — Anesthesia Preprocedure Evaluation (Signed)
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)      Neuro/Psych Anxiety negative neurological ROS     GI/Hepatic negative GI ROS, Neg liver ROS,   Endo/Other  diabetes, Type 2, Oral Hypoglycemic AgentsHypothyroidism   Renal/GU negative Renal ROS   Prostate cancer    Musculoskeletal negative musculoskeletal ROS (+)   Abdominal   Peds  Hematology negative hematology ROS (+)   Anesthesia Other Findings   Reproductive/Obstetrics                             Anesthesia Physical Anesthesia Plan  ASA: 2  Anesthesia Plan: General   Post-op Pain Management: Tylenol PO (pre-op)* and Toradol IV (intra-op)*   Induction: Intravenous  PONV Risk Score and Plan: 2 and Ondansetron, Dexamethasone, Treatment may vary due to age or medical condition and Midazolam  Airway Management Planned: Oral ETT  Additional Equipment: None  Intra-op Plan:   Post-operative Plan: Extubation in OR  Informed Consent: I have reviewed the patients History and Physical, chart, labs and discussed the procedure including the risks, benefits and alternatives for the proposed anesthesia with the patient or authorized representative who has indicated his/her understanding and acceptance.     Dental advisory given  Plan Discussed with:   Anesthesia Plan Comments:         Anesthesia Quick Evaluation

## 2021-12-01 NOTE — Anesthesia Procedure Notes (Signed)
Procedure Name: Intubation Date/Time: 12/01/2021 2:25 PM  Performed by: Rogers Blocker, CRNAPre-anesthesia Checklist: Patient identified, Emergency Drugs available, Suction available and Patient being monitored Patient Re-evaluated:Patient Re-evaluated prior to induction Oxygen Delivery Method: Circle System Utilized Preoxygenation: Pre-oxygenation with 100% oxygen Induction Type: IV induction Ventilation: Mask ventilation without difficulty Laryngoscope Size: Glidescope and 4 Grade View: Grade I Tube type: Oral Tube size: 7.5 mm Number of attempts: 2 Airway Equipment and Method: Video-laryngoscopy and Rigid stylet Placement Confirmation: ETT inserted through vocal cords under direct vision, positive ETCO2 and breath sounds checked- equal and bilateral Secured at: 22 cm Tube secured with: Tape Dental Injury: Teeth and Oropharynx as per pre-operative assessment  Difficulty Due To: Difficult Airway- due to anterior larynx Comments: Attempt x1 with Mac 4, Grade 3 view. Attempt with Mac 4, using Bougie stylet, unable to pass ET tube. Recommend Glyde Scope for future intubations.

## 2021-12-01 NOTE — Interval H&P Note (Deleted)
History and Physical Interval Note:  12/01/2021 1:02 PM  Stephen Zavala  has presented today for surgery, with the diagnosis of PROSTATE CANCER.  The various methods of treatment have been discussed with the patient and family. After consideration of risks, benefits and other options for treatment, the patient has consented to  Procedure(s) with comments: RADIOACTIVE SEED IMPLANT/BRACHYTHERAPY IMPLANT (N/A) - 90 MINS SPACE OAR INSTILLATION (N/A) as a surgical intervention.  The patient's history has been reviewed, patient examined, no change in status, stable for surgery.  I have reviewed the patient's chart and labs.  Questions were answered to the patient's satisfaction.     Stephen Zavala D Stephen Zavala

## 2021-12-01 NOTE — H&P (Deleted)
CC/HPI: cc: recurrent UTIs   09/11/21: 71 yo woman here for management of frequent UTIs. She has been getting UTIs for the last 3 years. She has been hospitalized 1 time for UTIs. She has a remote history of kidney stones when she was 71 years old. Currently she is voiding every hour during the day and night and has a heavy sensation in her pelvis. No gross hematuria. She does have a family history of bladder cancer in her father.    10/09/21: 45 year old woman with a history of chronic cystitis comes in with worsening bladder spasms and dysuria. The pain has improved since she called the office a few days ago but she is still very uncomfortable. Her last urinalysis did appear to be consistent with infection however urine culture showed no growth.   10/15/21: 71 year old woman with the above-noted past medical history. She presents today with worsening bladder spasms associated with dysuria, urinary frequency of every 1-2 hours and nocturia. She is also having some suprapubic discomfort. She denies fevers and chills. She has never had a kidney stone before. She has not seen any gross hematuria.   11/20/21: 71 yo woman with hx of frequent UTIs, frequency, urgency and bladder pain. Her last 3 urine cultures have been negative. She was given medication for bladder spasms when she went to ED.     ALLERGIES: pollen    MEDICATIONS: Metoprolol Succinate 25 mg tablet, extended release 24 hr  Vesicare 10 mg tablet 1 tablet PO Daily  Albuterol Sulfate  Benadryl  Calcium  Cephalexin 250 mg capsule 1 capsule PO Daily  Estradiol 0.01 % cream with applicator 1 gram Transdermal Q HS Place 1 g of cream in the vagina nightly for 14 days then twice a week thereafter  Folic Acid  Loperamide Hcl 1 mg/7.5 ml liquid  Methotrexate  Prednisone 5 mg tablet  Prolia 60 mg/ml syringe  Systane  Tizanidine Hcl 4 mg capsule     GU PSH: None     PSH Notes: adenoidectomy, appendectomy, D & C x 2, rotator cuff surgery,  rhinoplasty, laminectomy, diskectomy   NON-GU PSH: None   GU PMH: Chronic cystitis (with hematuria) - 10/15/2021, - 10/09/2021, - 09/11/2021 Dysuria - 10/15/2021 Pelvic/perineal pain - 10/15/2021 Urinary Frequency - 10/15/2021 Acute Cystitis/UTI - 10/09/2021, - 09/11/2021    NON-GU PMH: Anxiety Arthritis Asthma Depression Hypertension    FAMILY HISTORY: Bladder Cancer - Father   SOCIAL HISTORY: Marital Status: Married Preferred Language: English; Ethnicity: Not Hispanic Or Latino; Race: White Current Smoking Status: Patient has never smoked.   Tobacco Use Assessment Completed: Used Tobacco in last 30 days? Social Drinker.  Does not drink caffeine.    REVIEW OF SYSTEMS:    GU Review Male:   Patient reports frequent urination, burning /pain with urination, and get up at night to urinate. Patient denies hard to postpone urination, leakage of urine, stream starts and stops, trouble starting your stream, have to strain to urinate, and being pregnant.  Gastrointestinal (Upper):   Patient denies nausea, vomiting, and indigestion/ heartburn.  Gastrointestinal (Lower):   Patient denies constipation and diarrhea.  Constitutional:   Patient denies fever, night sweats, weight loss, and fatigue.  Skin:   Patient denies skin rash/ lesion and itching.  Eyes:   Patient denies blurred vision and double vision.  Ears/ Nose/ Throat:   Patient denies sore throat and sinus problems.  Hematologic/Lymphatic:   Patient denies swollen glands and easy bruising.  Cardiovascular:   Patient denies leg swelling  and chest pains.  Respiratory:   Patient denies cough and shortness of breath.  Endocrine:   Patient denies excessive thirst.  Musculoskeletal:   Patient denies back pain and joint pain.  Neurological:   Patient denies headaches and dizziness.  Psychologic:   Patient denies depression and anxiety.   VITAL SIGNS: None   MULTI-SYSTEM PHYSICAL EXAMINATION:    Constitutional: Well-nourished. No physical  deformities. Normally developed. Good grooming.  Neck: Neck symmetrical, not swollen. Normal tracheal position.  Respiratory: No labored breathing, no use of accessory muscles.   Skin: No paleness, no jaundice, no cyanosis. No lesion, no ulcer, no rash.  Neurologic / Psychiatric: Oriented to time, oriented to place, oriented to person. No depression, no anxiety, no agitation.  Eyes: Normal conjunctivae. Normal eyelids.  Ears, Nose, Mouth, and Throat: Left ear no scars, no lesions, no masses. Right ear no scars, no lesions, no masses. Nose no scars, no lesions, no masses. Normal hearing. Normal lips.  Musculoskeletal: Normal gait and station of head and neck.     Complexity of Data:  Records Review:   Previous Patient Records, POC Tool  Urine Test Review:   Urinalysis  X-Ray Review: C.T. Abdomen/Pelvis: Reviewed Films. Reviewed Report. Discussed With Patient. IMPRESSION:  Small nonobstructive calculus of the inferior pole of the left  kidney. No right-sided calculi, ureteral calculi, or hydronephrosis.   Aortic Atherosclerosis (ICD10-I70.0).    Electronically Signed  By: Delanna Ahmadi M.D.  On: 10/16/2021 10:56   Notes:                     11/08/2021: BUN 20, creatinine 0.8   PROCEDURES:         Flexible Cystoscopy - 52000  Risks, benefits, and some of the potential complications of the procedure were discussed at length with the patient including infection, bleeding, voiding discomfort, urinary retention, fever, chills, sepsis, and others. All questions were answered. Informed consent was obtained. Antibiotic prophylaxis was given. Sterile technique and intraurethral analgesia were used.  Meatus:  Normal size. Normal location. Normal condition.  Urethra:  No hypermobility. No leakage.  Ureteral Orifices:  Normal location. Normal size. Normal shape. Effluxed clear urine.  Bladder:  Erythematous, friable and bleeding area on posterior bladder wall       The lower urinary tract was  carefully examined. The procedure was well-tolerated and without complications. Antibiotic instructions were given. Instructions were given to call the office immediately for bloody urine, difficulty urinating, urinary retention, painful or frequent urination, fever, chills, nausea, vomiting or other illness. The patient stated that she understood these instructions and would comply with them.        In and Out Catheterization - 51701  A 14 French red rubber or straight catheter was inserted into the bladder using sterile technique. An interstitial cystitis cocktail was instilled into the bladder.         Anesthetic Cocktail - 62376 Patient cathed with a 38f RR and bladder completely drained. 10cc of 2% Lidocaine, 5cc of Sodium Bicarbonate, and 100 mg of solu- cortef was instilled into patient's bladder without difficulty.  Solu- Cortef 100 mg  Lot ##[id number] 04/2022          Urinalysis w/Scope Dipstick Dipstick Cont'd Micro  Color: Straw Bilirubin: Neg mg/dL WBC/hpf: 40 - 60/hpf  Appearance: Slightly Cloudy Ketones: Neg mg/dL RBC/hpf: 3 - 10/hpf  Specific Gravity: 1.015 Blood: 2+ ery/uL Bacteria: Few (10-25/hpf)  pH: 6.5 Protein: 1+ mg/dL Cystals: NS (Not Seen)  Glucose: Neg mg/dL Urobilinogen: 0.2 mg/dL Casts: Hyaline    Nitrites: Neg Trichomonas: Not Present    Leukocyte Esterase: 3+ leu/uL Mucous: Not Present      Epithelial Cells: NS (Not Seen)      Yeast: NS (Not Seen)      Sperm: Not Present    ASSESSMENT:      ICD-10 Details  1 GU:   Chronic cystitis (with hematuria) - N30.21 Chronic, Stable  2   Pelvic/perineal pain - R10.2 Chronic, Stable  3   Urinary Frequency - R35.0 Chronic, Stable  4   Dysuria - R30.0 Chronic, Stable   PLAN:           Document Letter(s):  Created for Patient: Clinical Summary         Notes:   1. Bladder lesion:  -Discussed results of cystoscopy with patient and recommend bladder bx with fulguration  -risks and benefits of procedure  discussed with pt in detail including but not limited to pain, bleeding, infection, damage to surrounding structures, bladder perforation, need for indwelling foley, need for additional treatment   2. Bladder pain:  -last urine cx was neg  -bladder instillation done today to see if this can help with symptoms

## 2021-12-01 NOTE — Discharge Instructions (Addendum)
DISCHARGE INSTRUCTIONS FOR PROSTATE SEED IMPLANTATION   Antibiotics You may be given a prescription for an antibiotic to take when you arrive home. If so, be sure to take every tablet in the bottle, even if you are feeling better before the prescription is finished. If you begin itching, notice a rash or start to swell on your trunk, arms, legs and/or throat, immediately stop taking the antibiotic and call your Urologist. Diet Resume your usual diet when you return home. To keep your bowels moving easily and softly, drink prune, apple and cranberry juice at room temperature. You may also take a stool softener, such as Colace, which is available without prescription at local pharmacies. Daily activities No driving or heavy lifting for at least two days after the implant. No bike riding, horseback riding or riding lawn mowers for the first month after the implant. Any strenuous physical activity should be approved by your doctor before you resume it. Sexual relations You may resume sexual relations two weeks after the procedure. A condom should be used for the first two weeks. Your semen may be dark brown or black; this is normal and is related bleeding that may have occurred during the implant. Postoperative swelling Expect swelling and bruising of the scrotum and perineum (the area between the scrotum and anus). Both the swelling and the bruising should resolve in l or 2 weeks. Ice packs and over- the-counter medications such as Tylenol, Advil or Aleve may lessen your discomfort. Postoperative urination Most men experience burning on urination and/or urinary frequency. If this becomes bothersome, contact your Urologist.  Medication can be prescribed to relieve these problems.  It is normal to have some blood in your urine for a few days after the implant. Special instructions related to the seeds It is unlikely that you will pass an Iodine-125 seed in your urine. The seeds are silver in color and  are about as large as a grain of rice. If you pass a seed, do not handle it with your fingers. Use a spoon to place it in an envelope or jar in place this in base occluded area such as the garage or basement for return to the radiation clinic at your convenience.  Contact your doctor for Temperature greater than 101 F Increasing pain Inability to urinate Follow-up  You should have follow up with your urologist and radiation oncologist about 3 weeks after the procedure. General information regarding Iodine seeds Iodine-125 is a low energy radioactive material. It is not deeply penetrating and loses energy at short distances. Your prostate will absorb the radiation. Objects that are touched or used by the patient do not become radioactive. Body wastes (urine and stool) or body fluids (saliva, tears, semen or blood) are not radioactive. The Nuclear Regulatory Commission (NRC) has determined that no radiation precautions are needed for patients undergoing Iodine-125 seed implantation. The NRC states that such patients do not present a risk to the people around them, including young children and pregnant women. However, in keeping with the general principle that radiation exposure should be kept as low reasonably possible, we suggest the following: Children and pets should not sit on the patient's lap for the first two (2) weeks after the implant. Pregnant (or possibly pregnant) women should avoid prolonged, close contact with the patient for the first two (2) weeks after the implant. A distance of three (3) feet is acceptable. At a distance of three (3) feet, there is no limit to the length of time anyone can   with the patient.  Post Anesthesia Home Care Instructions  Activity: Get plenty of rest for the remainder of the day. A responsible adult should stay with you for 24 hours following the procedure.  For the next 24 hours, DO NOT: -Drive a car -Paediatric nurse -Drink alcoholic beverages -Take  any medication unless instructed by your physician -Make any legal decisions or sign important papers.  Meals: Start with liquid foods such as gelatin or soup. Progress to regular foods as tolerated. Avoid greasy, spicy, heavy foods. If nausea and/or vomiting occur, drink only clear liquids until the nausea and/or vomiting subsides. Call your physician if vomiting continues.  Special Instructions/Symptoms: Your throat may feel dry or sore from the anesthesia or the breathing tube placed in your throat during surgery. If this causes discomfort, gargle with warm salt water. The discomfort should disappear within 24 hours.

## 2021-12-02 ENCOUNTER — Encounter (HOSPITAL_BASED_OUTPATIENT_CLINIC_OR_DEPARTMENT_OTHER): Payer: Self-pay | Admitting: Urology

## 2021-12-04 NOTE — H&P (Signed)
CC/HPI: cc: Prostate cancer    08/25/2021:71 year old man referred for an elevated PSA of 6.26 found have a palpable left prostate nodule. He underwent prostate biopsy on 07/17/2021 and biopsy results are noted below. He underwent staging imaging which did not show any concern for metastatic disease. There was indeterminate lesions in the liver for which an MRI is scheduled for next week to evaluate. He met with Dr. Tammi Klippel with radiation oncology and has decided to proceed with radiation. He is here today for his first ADT injection.   pre-biopsy PSA 6.26  Gleason 4+4=8 in 1/12 cores left mid  Gleason 4+3=7 in 2/12 cores left base and left mid  Gleason 3+4=7 in 1/12 cores right mid  66yrCSS 85%  PFP 45%, 30% at 5, 10 yrs  OC: 37%  ECE 61%  LNI 17%  SVI 13%   10/19/2021: MRI of the abdomen performed after last office visit was reassuring, benign. Patient also received 655-monthligard at that visit. Here today for preoperative appointment prior to undergoing radioactive seed implant as well as SpaceOAR on 6/13. He will then begin 5 weeks of XRT for treatment of his underlying prostate cancer diagnosis. Overall doing well. He has had some mild increase in fatigue and having some occasional hot flashes since beginning ADT but these are not limiting. He is staying active, trying to exercise on a routine basis. Tolerating a normal diet without nausea or or vomiting. He is having normal daily bowel movements. At baseline he does have some mild irritative and obstructive voiding symptoms but typically feels like he empties his bladder well without limiting or bothersome daytime frequency/urgency. Nocturia stable x2. He has had no interval dysuria or gross hematuria. No interval treatment for UTI or other infectious process. Patient denies any recent fever/chills, nausea/vomiting, chest pain or shortness of breath.     ALLERGIES: Statin Drugs    MEDICATIONS: Metformin Hcl  Alprazolam 0.5 mg tablet   Levothyroxine 150 mcg capsule  Rosuvastatin Calcium 20 mg tablet  Valsartan-Hydrochlorothiazide 160 mg-12.5 mg tablet  Vitamin B12  Vitamin D2 50 mcg (2,000 unit) tablet     GU PSH: Locm 300-399Mg/Ml Iodine,1Ml - 08/05/2021 Prostate Needle Biopsy - 07/17/2021     NON-GU PSH: Surgical Pathology, Gross And Microscopic Examination For Prostate Needle - 07/17/2021     GU PMH: Prostate Cancer - 08/25/2021, - 08/05/2021, - 07/24/2021 Elevated PSA - 07/17/2021, - 07/14/2021 Prostate nodule w/o LUTS - 07/17/2021, - 07/14/2021      PMH Notes: thyroid disease   NON-GU PMH: Hypercholesterolemia Hypertension    FAMILY HISTORY: 1 Daughter - Daughter 1 son - Son   SOCIAL HISTORY: Marital Status: Married Preferred Language: English; Ethnicity: Not Hispanic Or Latino; Race: Black or African American Current Smoking Status: Patient has never smoked.   Tobacco Use Assessment Completed: Used Tobacco in last 30 days? Social Drinker.  Drinks 1 caffeinated drink per day.    REVIEW OF SYSTEMS:    GU Review Male:   Patient reports get up at night to urinate. Patient denies frequent urination, hard to postpone urination, burning/ pain with urination, leakage of urine, stream starts and stops, trouble starting your stream, have to strain to urinate , erection problems, and penile pain.  Gastrointestinal (Upper):   Patient denies vomiting, nausea, and indigestion/ heartburn.  Gastrointestinal (Lower):   Patient denies diarrhea and constipation.  Constitutional:   Patient reports night sweats and fatigue. Patient denies fever and weight loss.  Skin:   Patient denies skin rash/  lesion and itching.  Eyes:   Patient denies blurred vision and double vision.  Ears/ Nose/ Throat:   Patient denies sore throat and sinus problems.  Hematologic/Lymphatic:   Patient denies swollen glands and easy bruising.  Cardiovascular:   Patient denies leg swelling and chest pains.  Respiratory:   Patient denies cough and  shortness of breath.  Endocrine:   Patient denies excessive thirst.  Musculoskeletal:   Patient denies back pain and joint pain.  Neurological:   Patient denies headaches and dizziness.  Psychologic:   Patient denies depression and anxiety.   VITAL SIGNS:      10/19/2021 10:56 AM  BP 146/83 mmHg  Heart Rate 62 /min  Temperature 98.2 F / 36.7 C   MULTI-SYSTEM PHYSICAL EXAMINATION:    Constitutional: Well-nourished. No physical deformities. Normally developed. Good grooming.  Neck: Neck symmetrical, not swollen. Normal tracheal position.  Respiratory: No labored breathing, no use of accessory muscles.   Cardiovascular: Normal temperature, normal extremity pulses, no swelling, no varicosities.  Skin: No paleness, no jaundice, no cyanosis. No lesion, no ulcer, no rash.  Neurologic / Psychiatric: Oriented to time, oriented to place, oriented to person. No depression, no anxiety, no agitation.  Gastrointestinal: No mass, no tenderness, no rigidity, non obese abdomen.  Musculoskeletal: Normal gait and station of head and neck.     Complexity of Data:  Source Of History:  Patient, Family/Caregiver, Medical Record Summary  Lab Test Review:   PSA  Records Review:   Pathology Reports, Previous Doctor Records, Previous Hospital Records, Previous Patient Records  Urine Test Review:   Urinalysis   10/19/21  Urinalysis  Urine Appearance Clear   Urine Color Yellow   Urine Glucose Neg mg/dL  Urine Bilirubin Neg mg/dL  Urine Ketones Neg mg/dL  Urine Specific Gravity 1.010   Urine Blood Neg ery/uL  Urine pH 6.0   Urine Protein Neg mg/dL  Urine Urobilinogen 0.2 mg/dL  Urine Nitrites Neg   Urine Leukocyte Esterase Neg leu/uL   PROCEDURES:          Urinalysis Dipstick Dipstick Cont'd  Color: Yellow Bilirubin: Neg mg/dL  Appearance: Clear Ketones: Neg mg/dL  Specific Gravity: 1.010 Blood: Neg ery/uL  pH: 6.0 Protein: Neg mg/dL  Glucose: Neg mg/dL Urobilinogen: 0.2 mg/dL    Nitrites: Neg     Leukocyte Esterase: Neg leu/uL    ASSESSMENT:      ICD-10 Details  1 GU:   Prostate Cancer - C61 Chronic, Stable  2   Nocturia - R35.1 Chronic, Stable   PLAN:           Orders Labs Urine Culture          Schedule Return Visit/Planned Activity: Keep Scheduled Appointment - Follow up MD, Schedule Surgery          Document Letter(s):  Created for Patient: Clinical Summary         Notes:   All questions answered to the best of my ability regarding the upcoming procedure and expected postoperative course with understanding expressed by the patient and his wife. Urine culture sent today to serve as a baseline. He will proceed with radioactive seed implant and SpaceOAR insertion on 6/13. This will be followed by 5 weeks of XRT a couple of weeks later under the guidance of radiation oncology.

## 2021-12-04 NOTE — Interval H&P Note (Signed)
History and Physical Interval Note:  12/04/2021 9:17 AM  Stephen Zavala  has presented today for surgery, with the diagnosis of PROSTATE CANCER.  The various methods of treatment have been discussed with the patient and family. After consideration of risks, benefits and other options for treatment, the patient has consented to  Procedure(s) with comments: RADIOACTIVE SEED IMPLANT/BRACHYTHERAPY IMPLANT (N/A) - 90 MINS SPACE OAR INSTILLATION (N/A) CYSTOSCOPY FLEXIBLE (N/A) - No seeds detected in bladder per Dr. Claudia Desanctis as a surgical intervention.  The patient's history has been reviewed, patient examined, no change in status, stable for surgery.  I have reviewed the patient's chart and labs.  Questions were answered to the patient's satisfaction.     Freda Jaquith D Francheska Villeda

## 2021-12-15 DIAGNOSIS — C61 Malignant neoplasm of prostate: Secondary | ICD-10-CM | POA: Diagnosis not present

## 2021-12-17 ENCOUNTER — Telehealth: Payer: Self-pay | Admitting: *Deleted

## 2021-12-17 NOTE — Telephone Encounter (Signed)
Called patient to remind of sim appt. for 12-18-21- arrival time- 12:45 pm, spoke with patient and he is aware of this appt.

## 2021-12-18 ENCOUNTER — Ambulatory Visit
Admission: RE | Admit: 2021-12-18 | Discharge: 2021-12-18 | Disposition: A | Discharge status: Home / Self Care | Payer: Medicare Other | Source: Ambulatory Visit | Attending: Radiation Oncology | Admitting: Radiation Oncology

## 2021-12-18 ENCOUNTER — Other Ambulatory Visit: Payer: Self-pay

## 2021-12-18 ENCOUNTER — Ambulatory Visit: Payer: Medicare Other | Admitting: Radiation Oncology

## 2021-12-18 DIAGNOSIS — C61 Malignant neoplasm of prostate: Secondary | ICD-10-CM | POA: Diagnosis not present

## 2021-12-18 DIAGNOSIS — Z510 Encounter for antineoplastic radiation therapy: Secondary | ICD-10-CM | POA: Insufficient documentation

## 2021-12-18 DIAGNOSIS — Z191 Hormone sensitive malignancy status: Secondary | ICD-10-CM | POA: Diagnosis not present

## 2021-12-19 NOTE — Progress Notes (Signed)
  Radiation Oncology         [phone] ________________________________  Name: Stephen Zavala MRN: [id number]  Date: 12/18/2021  DOB: 06-25-1950  COMPLEX SIMULATION NOTE  NARRATIVE:  The patient was brought to the [facility] today following prostate seed implantation approximately one month ago.  Identity was confirmed.  All relevant records and images related to the planned course of therapy were reviewed.  Then, the patient was set-up supine.  CT images were obtained.  The CT images were loaded into the planning software.  Then the prostate and rectum were contoured.  Treatment planning then occurred.  The implanted iodine 125 seeds were identified by the physics staff for projection of radiation distribution  I have requested : 3D Simulation  I have requested a DVH of the following structures: Prostate and rectum.    ________________________________  Sheral Apley Tammi Klippel, M.D.

## 2021-12-19 NOTE — Progress Notes (Signed)
  Radiation Oncology         [phone] ________________________________  Name: Stephen Zavala MRN: [id number]  Date: 12/18/2021  DOB: [birth year]  SIMULATION AND TREATMENT PLANNING NOTE    ICD-10-CM   1. Malignant neoplasm of prostate ([place])  C61       DIAGNOSIS:  71 y.o. 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   NARRATIVE:  The patient was brought to the [facility].  Identity was confirmed.  All relevant records and images related to the planned course of therapy were reviewed.  The patient freely provided informed written consent to proceed with treatment after reviewing the details related to the planned course of therapy. The consent form was witnessed and verified by the simulation staff.  Then, the patient was set-up in a stable reproducible supine position for radiation therapy.  A vacuum lock pillow device was custom fabricated to position his legs in a reproducible immobilized position.  Then, I performed a urethrogram under sterile conditions to identify the prostatic apex.  CT images were obtained.  Surface markings were placed.  The CT images were loaded into the planning software.  Then the prostate target and avoidance structures including the rectum, bladder, bowel and hips were contoured.  Treatment planning then occurred.  The radiation prescription was entered and confirmed.  A total of one complex treatment devices were fabricated. I have requested : Intensity Modulated Radiotherapy (IMRT) is medically necessary for this case for the following reason:  Rectal sparing.Marland Kitchen  PLAN:  The patient will receive 45 Gy in 25 fractions of 1.8 Gy, to supplement an up-front prostate seed implant boost of 110 Gy to achieve a total nominal dose of 155 Gy.  ________________________________  Sheral Apley Tammi Klippel, M.D.

## 2021-12-28 ENCOUNTER — Encounter: Payer: Self-pay | Admitting: Radiation Oncology

## 2021-12-28 DIAGNOSIS — C61 Malignant neoplasm of prostate: Secondary | ICD-10-CM | POA: Diagnosis not present

## 2021-12-28 DIAGNOSIS — Z191 Hormone sensitive malignancy status: Secondary | ICD-10-CM | POA: Diagnosis not present

## 2021-12-28 DIAGNOSIS — Z510 Encounter for antineoplastic radiation therapy: Secondary | ICD-10-CM | POA: Diagnosis not present

## 2021-12-29 ENCOUNTER — Ambulatory Visit
Admission: RE | Admit: 2021-12-29 | Discharge: 2021-12-29 | Disposition: A | Discharge status: Home / Self Care | Payer: Medicare Other | Source: Ambulatory Visit | Attending: Radiation Oncology | Admitting: Radiation Oncology

## 2021-12-29 ENCOUNTER — Other Ambulatory Visit: Payer: Self-pay

## 2021-12-29 DIAGNOSIS — Z510 Encounter for antineoplastic radiation therapy: Secondary | ICD-10-CM | POA: Diagnosis not present

## 2021-12-29 DIAGNOSIS — Z191 Hormone sensitive malignancy status: Secondary | ICD-10-CM | POA: Diagnosis not present

## 2021-12-29 DIAGNOSIS — C61 Malignant neoplasm of prostate: Secondary | ICD-10-CM | POA: Diagnosis not present

## 2021-12-29 LAB — RAD ONC ARIA SESSION SUMMARY
Course Elapsed Days: 0
Plan Fractions Treated to Date: 1
Plan Prescribed Dose Per Fraction: 1.8 Gy
Plan Total Fractions Prescribed: 25
Plan Total Prescribed Dose: 45 Gy
Reference Point Dosage Given to Date: 1.8 Gy
Reference Point Session Dosage Given: 1.8 Gy
Session Number: 1

## 2021-12-29 NOTE — Progress Notes (Signed)
  Radiation Oncology         [phone] ________________________________  Name: Stephen Zavala MRN: [id number]  Date: 12/28/2021  DOB: Jul 25, 1950  3D Planning Note   Prostate Brachytherapy Post-Implant Dosimetry  Diagnosis: 71 y.o. 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  Narrative: On a previous date, [name] returned following prostate seed implantation for post implant planning. He underwent CT scan complex simulation to delineate the three-dimensional structures of the pelvis and demonstrate the radiation distribution.  Since that time, the seed localization, and complex isodose planning with dose volume histograms have now been completed.  Results:   Prostate Coverage - The dose of radiation delivered to the 90% or more of the prostate gland (D90) was 101.9% of the prescription dose. This exceeds our goal of greater than 90%. Rectal Sparing - The volume of rectal tissue receiving the prescription dose or higher was 0.0 cc. This falls under our thresholds tolerance of 1.0 cc.  Impression: The prostate seed implant appears to show adequate target coverage and appropriate rectal sparing.  Plan:  The patient will continue to follow with urology for ongoing PSA determinations. I would anticipate a high likelihood for local tumor control with minimal risk for rectal morbidity.  ________________________________  Sheral Apley Tammi Klippel, M.D.

## 2021-12-30 ENCOUNTER — Other Ambulatory Visit: Payer: Self-pay

## 2021-12-30 ENCOUNTER — Ambulatory Visit
Admission: RE | Admit: 2021-12-30 | Discharge: 2021-12-30 | Disposition: A | Discharge status: Home / Self Care | Payer: Medicare Other | Source: Ambulatory Visit | Attending: Radiation Oncology | Admitting: Radiation Oncology

## 2021-12-30 DIAGNOSIS — Z191 Hormone sensitive malignancy status: Secondary | ICD-10-CM | POA: Diagnosis not present

## 2021-12-30 DIAGNOSIS — C61 Malignant neoplasm of prostate: Secondary | ICD-10-CM | POA: Diagnosis not present

## 2021-12-30 DIAGNOSIS — Z510 Encounter for antineoplastic radiation therapy: Secondary | ICD-10-CM | POA: Diagnosis not present

## 2021-12-30 LAB — RAD ONC ARIA SESSION SUMMARY
Course Elapsed Days: 1
Plan Fractions Treated to Date: 2
Plan Prescribed Dose Per Fraction: 1.8 Gy
Plan Total Fractions Prescribed: 25
Plan Total Prescribed Dose: 45 Gy
Reference Point Dosage Given to Date: 3.6 Gy
Reference Point Session Dosage Given: 1.8 Gy
Session Number: 2

## 2021-12-31 ENCOUNTER — Ambulatory Visit
Admission: RE | Admit: 2021-12-31 | Discharge: 2021-12-31 | Disposition: A | Discharge status: Home / Self Care | Payer: Medicare Other | Source: Ambulatory Visit | Attending: Radiation Oncology | Admitting: Radiation Oncology

## 2021-12-31 ENCOUNTER — Other Ambulatory Visit: Payer: Self-pay

## 2021-12-31 DIAGNOSIS — Z191 Hormone sensitive malignancy status: Secondary | ICD-10-CM | POA: Diagnosis not present

## 2021-12-31 DIAGNOSIS — C61 Malignant neoplasm of prostate: Secondary | ICD-10-CM | POA: Diagnosis not present

## 2021-12-31 DIAGNOSIS — Z510 Encounter for antineoplastic radiation therapy: Secondary | ICD-10-CM | POA: Diagnosis not present

## 2021-12-31 LAB — RAD ONC ARIA SESSION SUMMARY
Course Elapsed Days: 2
Plan Fractions Treated to Date: 3
Plan Prescribed Dose Per Fraction: 1.8 Gy
Plan Total Fractions Prescribed: 25
Plan Total Prescribed Dose: 45 Gy
Reference Point Dosage Given to Date: 5.4 Gy
Reference Point Session Dosage Given: 1.8 Gy
Session Number: 3

## 2022-01-01 ENCOUNTER — Ambulatory Visit
Admission: RE | Admit: 2022-01-01 | Discharge: 2022-01-01 | Disposition: A | Discharge status: Home / Self Care | Payer: Medicare Other | Source: Ambulatory Visit | Attending: Radiation Oncology | Admitting: Radiation Oncology

## 2022-01-01 ENCOUNTER — Other Ambulatory Visit: Payer: Self-pay

## 2022-01-01 DIAGNOSIS — Z510 Encounter for antineoplastic radiation therapy: Secondary | ICD-10-CM | POA: Diagnosis not present

## 2022-01-01 DIAGNOSIS — C61 Malignant neoplasm of prostate: Secondary | ICD-10-CM | POA: Diagnosis not present

## 2022-01-01 DIAGNOSIS — Z191 Hormone sensitive malignancy status: Secondary | ICD-10-CM | POA: Diagnosis not present

## 2022-01-01 LAB — RAD ONC ARIA SESSION SUMMARY
Course Elapsed Days: 3
Plan Fractions Treated to Date: 4
Plan Prescribed Dose Per Fraction: 1.8 Gy
Plan Total Fractions Prescribed: 25
Plan Total Prescribed Dose: 45 Gy
Reference Point Dosage Given to Date: 7.2 Gy
Reference Point Session Dosage Given: 1.8 Gy
Session Number: 4

## 2022-01-04 ENCOUNTER — Ambulatory Visit
Admission: RE | Admit: 2022-01-04 | Discharge: 2022-01-04 | Disposition: A | Discharge status: Home / Self Care | Payer: Medicare Other | Source: Ambulatory Visit | Attending: Radiation Oncology | Admitting: Radiation Oncology

## 2022-01-04 ENCOUNTER — Other Ambulatory Visit: Payer: Self-pay

## 2022-01-04 DIAGNOSIS — Z191 Hormone sensitive malignancy status: Secondary | ICD-10-CM | POA: Diagnosis not present

## 2022-01-04 DIAGNOSIS — C61 Malignant neoplasm of prostate: Secondary | ICD-10-CM | POA: Diagnosis not present

## 2022-01-04 DIAGNOSIS — Z510 Encounter for antineoplastic radiation therapy: Secondary | ICD-10-CM | POA: Diagnosis not present

## 2022-01-04 LAB — RAD ONC ARIA SESSION SUMMARY
Course Elapsed Days: 6
Plan Fractions Treated to Date: 5
Plan Prescribed Dose Per Fraction: 1.8 Gy
Plan Total Fractions Prescribed: 25
Plan Total Prescribed Dose: 45 Gy
Reference Point Dosage Given to Date: 9 Gy
Reference Point Session Dosage Given: 1.8 Gy
Session Number: 5

## 2022-01-05 ENCOUNTER — Other Ambulatory Visit: Payer: Self-pay

## 2022-01-05 ENCOUNTER — Ambulatory Visit
Admission: RE | Admit: 2022-01-05 | Discharge: 2022-01-05 | Disposition: A | Discharge status: Home / Self Care | Payer: Medicare Other | Source: Ambulatory Visit | Attending: Radiation Oncology | Admitting: Radiation Oncology

## 2022-01-05 DIAGNOSIS — Z191 Hormone sensitive malignancy status: Secondary | ICD-10-CM | POA: Diagnosis not present

## 2022-01-05 DIAGNOSIS — C61 Malignant neoplasm of prostate: Secondary | ICD-10-CM | POA: Diagnosis not present

## 2022-01-05 DIAGNOSIS — Z510 Encounter for antineoplastic radiation therapy: Secondary | ICD-10-CM | POA: Diagnosis not present

## 2022-01-05 LAB — RAD ONC ARIA SESSION SUMMARY
Course Elapsed Days: 7
Plan Fractions Treated to Date: 6
Plan Prescribed Dose Per Fraction: 1.8 Gy
Plan Total Fractions Prescribed: 25
Plan Total Prescribed Dose: 45 Gy
Reference Point Dosage Given to Date: 10.8 Gy
Reference Point Session Dosage Given: 1.8 Gy
Session Number: 6

## 2022-01-06 ENCOUNTER — Other Ambulatory Visit: Payer: Self-pay

## 2022-01-06 ENCOUNTER — Ambulatory Visit
Admission: RE | Admit: 2022-01-06 | Discharge: 2022-01-06 | Disposition: A | Discharge status: Home / Self Care | Payer: Medicare Other | Source: Ambulatory Visit | Attending: Radiation Oncology | Admitting: Radiation Oncology

## 2022-01-06 DIAGNOSIS — Z510 Encounter for antineoplastic radiation therapy: Secondary | ICD-10-CM | POA: Diagnosis not present

## 2022-01-06 DIAGNOSIS — Z191 Hormone sensitive malignancy status: Secondary | ICD-10-CM | POA: Diagnosis not present

## 2022-01-06 DIAGNOSIS — R351 Nocturia: Secondary | ICD-10-CM | POA: Diagnosis not present

## 2022-01-06 DIAGNOSIS — C61 Malignant neoplasm of prostate: Secondary | ICD-10-CM | POA: Diagnosis not present

## 2022-01-06 LAB — RAD ONC ARIA SESSION SUMMARY
Course Elapsed Days: 8
Plan Fractions Treated to Date: 7
Plan Prescribed Dose Per Fraction: 1.8 Gy
Plan Total Fractions Prescribed: 25
Plan Total Prescribed Dose: 45 Gy
Reference Point Dosage Given to Date: 12.6 Gy
Reference Point Session Dosage Given: 1.8 Gy
Session Number: 7

## 2022-01-07 ENCOUNTER — Ambulatory Visit
Admission: RE | Admit: 2022-01-07 | Discharge: 2022-01-07 | Disposition: A | Discharge status: Home / Self Care | Payer: Medicare Other | Source: Ambulatory Visit | Attending: Radiation Oncology | Admitting: Radiation Oncology

## 2022-01-07 ENCOUNTER — Other Ambulatory Visit: Payer: Self-pay

## 2022-01-07 ENCOUNTER — Ambulatory Visit: Payer: Medicare Other

## 2022-01-07 DIAGNOSIS — Z191 Hormone sensitive malignancy status: Secondary | ICD-10-CM | POA: Diagnosis not present

## 2022-01-07 DIAGNOSIS — C61 Malignant neoplasm of prostate: Secondary | ICD-10-CM | POA: Diagnosis not present

## 2022-01-07 DIAGNOSIS — Z510 Encounter for antineoplastic radiation therapy: Secondary | ICD-10-CM | POA: Diagnosis not present

## 2022-01-07 LAB — RAD ONC ARIA SESSION SUMMARY
Course Elapsed Days: 9
Plan Fractions Treated to Date: 8
Plan Prescribed Dose Per Fraction: 1.8 Gy
Plan Total Fractions Prescribed: 25
Plan Total Prescribed Dose: 45 Gy
Reference Point Dosage Given to Date: 14.4 Gy
Reference Point Session Dosage Given: 1.8 Gy
Session Number: 8

## 2022-01-08 ENCOUNTER — Ambulatory Visit
Admission: RE | Admit: 2022-01-08 | Discharge: 2022-01-08 | Disposition: A | Discharge status: Home / Self Care | Payer: Medicare Other | Source: Ambulatory Visit | Attending: Radiation Oncology | Admitting: Radiation Oncology

## 2022-01-08 ENCOUNTER — Other Ambulatory Visit: Payer: Self-pay

## 2022-01-08 DIAGNOSIS — Z510 Encounter for antineoplastic radiation therapy: Secondary | ICD-10-CM | POA: Diagnosis not present

## 2022-01-08 DIAGNOSIS — C61 Malignant neoplasm of prostate: Secondary | ICD-10-CM | POA: Diagnosis not present

## 2022-01-08 DIAGNOSIS — Z191 Hormone sensitive malignancy status: Secondary | ICD-10-CM | POA: Diagnosis not present

## 2022-01-08 LAB — RAD ONC ARIA SESSION SUMMARY
Course Elapsed Days: 10
Plan Fractions Treated to Date: 9
Plan Prescribed Dose Per Fraction: 1.8 Gy
Plan Total Fractions Prescribed: 25
Plan Total Prescribed Dose: 45 Gy
Reference Point Dosage Given to Date: 16.2 Gy
Reference Point Session Dosage Given: 1.8 Gy
Session Number: 9

## 2022-01-11 ENCOUNTER — Other Ambulatory Visit: Payer: Self-pay

## 2022-01-11 ENCOUNTER — Ambulatory Visit
Admission: RE | Admit: 2022-01-11 | Discharge: 2022-01-11 | Disposition: A | Discharge status: Home / Self Care | Payer: Medicare Other | Source: Ambulatory Visit | Attending: Radiation Oncology | Admitting: Radiation Oncology

## 2022-01-11 DIAGNOSIS — Z510 Encounter for antineoplastic radiation therapy: Secondary | ICD-10-CM | POA: Diagnosis not present

## 2022-01-11 DIAGNOSIS — C61 Malignant neoplasm of prostate: Secondary | ICD-10-CM | POA: Diagnosis not present

## 2022-01-11 DIAGNOSIS — Z191 Hormone sensitive malignancy status: Secondary | ICD-10-CM | POA: Diagnosis not present

## 2022-01-11 LAB — RAD ONC ARIA SESSION SUMMARY
Course Elapsed Days: 13
Plan Fractions Treated to Date: 10
Plan Prescribed Dose Per Fraction: 1.8 Gy
Plan Total Fractions Prescribed: 25
Plan Total Prescribed Dose: 45 Gy
Reference Point Dosage Given to Date: 18 Gy
Reference Point Session Dosage Given: 1.8 Gy
Session Number: 10

## 2022-01-12 ENCOUNTER — Other Ambulatory Visit: Payer: Self-pay

## 2022-01-12 ENCOUNTER — Ambulatory Visit
Admission: RE | Admit: 2022-01-12 | Discharge: 2022-01-12 | Disposition: A | Discharge status: Home / Self Care | Payer: Medicare Other | Source: Ambulatory Visit | Attending: Radiation Oncology | Admitting: Radiation Oncology

## 2022-01-12 DIAGNOSIS — C61 Malignant neoplasm of prostate: Secondary | ICD-10-CM | POA: Diagnosis not present

## 2022-01-12 DIAGNOSIS — Z510 Encounter for antineoplastic radiation therapy: Secondary | ICD-10-CM | POA: Diagnosis not present

## 2022-01-12 DIAGNOSIS — Z191 Hormone sensitive malignancy status: Secondary | ICD-10-CM | POA: Diagnosis not present

## 2022-01-12 LAB — RAD ONC ARIA SESSION SUMMARY
Course Elapsed Days: 14
Plan Fractions Treated to Date: 11
Plan Prescribed Dose Per Fraction: 1.8 Gy
Plan Total Fractions Prescribed: 25
Plan Total Prescribed Dose: 45 Gy
Reference Point Dosage Given to Date: 19.8 Gy
Reference Point Session Dosage Given: 1.8 Gy
Session Number: 11

## 2022-01-13 ENCOUNTER — Ambulatory Visit
Admission: RE | Admit: 2022-01-13 | Discharge: 2022-01-13 | Disposition: A | Discharge status: Home / Self Care | Payer: Medicare Other | Source: Ambulatory Visit | Attending: Radiation Oncology | Admitting: Radiation Oncology

## 2022-01-13 ENCOUNTER — Other Ambulatory Visit: Payer: Self-pay

## 2022-01-13 DIAGNOSIS — Z191 Hormone sensitive malignancy status: Secondary | ICD-10-CM | POA: Diagnosis not present

## 2022-01-13 DIAGNOSIS — Z510 Encounter for antineoplastic radiation therapy: Secondary | ICD-10-CM | POA: Diagnosis not present

## 2022-01-13 DIAGNOSIS — C61 Malignant neoplasm of prostate: Secondary | ICD-10-CM | POA: Diagnosis not present

## 2022-01-13 LAB — RAD ONC ARIA SESSION SUMMARY
Course Elapsed Days: 15
Plan Fractions Treated to Date: 12
Plan Prescribed Dose Per Fraction: 1.8 Gy
Plan Total Fractions Prescribed: 25
Plan Total Prescribed Dose: 45 Gy
Reference Point Dosage Given to Date: 21.6 Gy
Reference Point Session Dosage Given: 1.8 Gy
Session Number: 12

## 2022-01-14 ENCOUNTER — Other Ambulatory Visit: Payer: Self-pay

## 2022-01-14 ENCOUNTER — Ambulatory Visit: Payer: Medicare Other

## 2022-01-14 ENCOUNTER — Ambulatory Visit
Admission: RE | Admit: 2022-01-14 | Discharge: 2022-01-14 | Disposition: A | Discharge status: Home / Self Care | Payer: Medicare Other | Source: Ambulatory Visit | Attending: Radiation Oncology | Admitting: Radiation Oncology

## 2022-01-14 DIAGNOSIS — Z510 Encounter for antineoplastic radiation therapy: Secondary | ICD-10-CM | POA: Diagnosis not present

## 2022-01-14 DIAGNOSIS — Z191 Hormone sensitive malignancy status: Secondary | ICD-10-CM | POA: Diagnosis not present

## 2022-01-14 DIAGNOSIS — C61 Malignant neoplasm of prostate: Secondary | ICD-10-CM | POA: Diagnosis not present

## 2022-01-14 LAB — RAD ONC ARIA SESSION SUMMARY
Course Elapsed Days: 16
Plan Fractions Treated to Date: 13
Plan Prescribed Dose Per Fraction: 1.8 Gy
Plan Total Fractions Prescribed: 25
Plan Total Prescribed Dose: 45 Gy
Reference Point Dosage Given to Date: 23.4 Gy
Reference Point Session Dosage Given: 1.8 Gy
Session Number: 13

## 2022-01-15 ENCOUNTER — Ambulatory Visit
Admission: RE | Admit: 2022-01-15 | Discharge: 2022-01-15 | Disposition: A | Discharge status: Home / Self Care | Payer: Medicare Other | Source: Ambulatory Visit | Attending: Radiation Oncology | Admitting: Radiation Oncology

## 2022-01-15 ENCOUNTER — Other Ambulatory Visit: Payer: Self-pay

## 2022-01-15 DIAGNOSIS — C61 Malignant neoplasm of prostate: Secondary | ICD-10-CM | POA: Diagnosis not present

## 2022-01-15 DIAGNOSIS — Z510 Encounter for antineoplastic radiation therapy: Secondary | ICD-10-CM | POA: Diagnosis not present

## 2022-01-15 DIAGNOSIS — Z191 Hormone sensitive malignancy status: Secondary | ICD-10-CM | POA: Diagnosis not present

## 2022-01-15 LAB — RAD ONC ARIA SESSION SUMMARY
Course Elapsed Days: 17
Plan Fractions Treated to Date: 14
Plan Prescribed Dose Per Fraction: 1.8 Gy
Plan Total Fractions Prescribed: 25
Plan Total Prescribed Dose: 45 Gy
Reference Point Dosage Given to Date: 25.2 Gy
Reference Point Session Dosage Given: 1.8 Gy
Session Number: 14

## 2022-01-19 ENCOUNTER — Other Ambulatory Visit: Payer: Self-pay

## 2022-01-19 ENCOUNTER — Ambulatory Visit
Admission: RE | Admit: 2022-01-19 | Discharge: 2022-01-19 | Disposition: A | Discharge status: Home / Self Care | Payer: Medicare Other | Source: Ambulatory Visit | Attending: Radiation Oncology | Admitting: Radiation Oncology

## 2022-01-19 DIAGNOSIS — Z191 Hormone sensitive malignancy status: Secondary | ICD-10-CM | POA: Diagnosis not present

## 2022-01-19 DIAGNOSIS — C61 Malignant neoplasm of prostate: Secondary | ICD-10-CM | POA: Diagnosis not present

## 2022-01-19 DIAGNOSIS — Z510 Encounter for antineoplastic radiation therapy: Secondary | ICD-10-CM | POA: Diagnosis not present

## 2022-01-19 LAB — RAD ONC ARIA SESSION SUMMARY
Course Elapsed Days: 21
Plan Fractions Treated to Date: 15
Plan Prescribed Dose Per Fraction: 1.8 Gy
Plan Total Fractions Prescribed: 25
Plan Total Prescribed Dose: 45 Gy
Reference Point Dosage Given to Date: 27 Gy
Reference Point Session Dosage Given: 1.8 Gy
Session Number: 15

## 2022-01-20 ENCOUNTER — Ambulatory Visit
Admission: RE | Admit: 2022-01-20 | Discharge: 2022-01-20 | Disposition: A | Discharge status: Home / Self Care | Payer: Medicare Other | Source: Ambulatory Visit | Attending: Radiation Oncology | Admitting: Radiation Oncology

## 2022-01-20 ENCOUNTER — Other Ambulatory Visit: Payer: Self-pay

## 2022-01-20 DIAGNOSIS — Z510 Encounter for antineoplastic radiation therapy: Secondary | ICD-10-CM | POA: Diagnosis not present

## 2022-01-20 DIAGNOSIS — C61 Malignant neoplasm of prostate: Secondary | ICD-10-CM | POA: Diagnosis not present

## 2022-01-20 DIAGNOSIS — Z191 Hormone sensitive malignancy status: Secondary | ICD-10-CM | POA: Diagnosis not present

## 2022-01-20 LAB — RAD ONC ARIA SESSION SUMMARY
Course Elapsed Days: 22
Plan Fractions Treated to Date: 16
Plan Prescribed Dose Per Fraction: 1.8 Gy
Plan Total Fractions Prescribed: 25
Plan Total Prescribed Dose: 45 Gy
Reference Point Dosage Given to Date: 28.8 Gy
Reference Point Session Dosage Given: 1.8 Gy
Session Number: 16

## 2022-01-21 ENCOUNTER — Ambulatory Visit: Payer: Medicare Other

## 2022-01-21 ENCOUNTER — Other Ambulatory Visit: Payer: Self-pay

## 2022-01-21 ENCOUNTER — Ambulatory Visit
Admission: RE | Admit: 2022-01-21 | Discharge: 2022-01-21 | Disposition: A | Discharge status: Home / Self Care | Payer: Medicare Other | Source: Ambulatory Visit | Attending: Radiation Oncology | Admitting: Radiation Oncology

## 2022-01-21 DIAGNOSIS — C61 Malignant neoplasm of prostate: Secondary | ICD-10-CM | POA: Diagnosis not present

## 2022-01-21 DIAGNOSIS — Z510 Encounter for antineoplastic radiation therapy: Secondary | ICD-10-CM | POA: Diagnosis not present

## 2022-01-21 DIAGNOSIS — Z191 Hormone sensitive malignancy status: Secondary | ICD-10-CM | POA: Diagnosis not present

## 2022-01-21 LAB — RAD ONC ARIA SESSION SUMMARY
Course Elapsed Days: 23
Plan Fractions Treated to Date: 17
Plan Prescribed Dose Per Fraction: 1.8 Gy
Plan Total Fractions Prescribed: 25
Plan Total Prescribed Dose: 45 Gy
Reference Point Dosage Given to Date: 30.6 Gy
Reference Point Session Dosage Given: 1.8 Gy
Session Number: 17

## 2022-01-22 ENCOUNTER — Other Ambulatory Visit: Payer: Self-pay

## 2022-01-22 ENCOUNTER — Ambulatory Visit: Payer: Medicare Other

## 2022-01-22 ENCOUNTER — Ambulatory Visit
Admission: RE | Admit: 2022-01-22 | Discharge: 2022-01-22 | Disposition: A | Discharge status: Home / Self Care | Payer: Medicare Other | Source: Ambulatory Visit | Attending: Radiation Oncology | Admitting: Radiation Oncology

## 2022-01-22 DIAGNOSIS — C61 Malignant neoplasm of prostate: Secondary | ICD-10-CM | POA: Diagnosis not present

## 2022-01-22 DIAGNOSIS — Z510 Encounter for antineoplastic radiation therapy: Secondary | ICD-10-CM | POA: Diagnosis not present

## 2022-01-22 DIAGNOSIS — Z191 Hormone sensitive malignancy status: Secondary | ICD-10-CM | POA: Diagnosis not present

## 2022-01-22 LAB — RAD ONC ARIA SESSION SUMMARY
Course Elapsed Days: 24
Plan Fractions Treated to Date: 18
Plan Prescribed Dose Per Fraction: 1.8 Gy
Plan Total Fractions Prescribed: 25
Plan Total Prescribed Dose: 45 Gy
Reference Point Dosage Given to Date: 32.4 Gy
Reference Point Session Dosage Given: 1.8 Gy
Session Number: 18

## 2022-01-25 ENCOUNTER — Other Ambulatory Visit: Payer: Self-pay

## 2022-01-25 ENCOUNTER — Ambulatory Visit
Admission: RE | Admit: 2022-01-25 | Discharge: 2022-01-25 | Disposition: A | Discharge status: Home / Self Care | Payer: Medicare Other | Source: Ambulatory Visit | Attending: Radiation Oncology | Admitting: Radiation Oncology

## 2022-01-25 DIAGNOSIS — Z191 Hormone sensitive malignancy status: Secondary | ICD-10-CM | POA: Diagnosis not present

## 2022-01-25 DIAGNOSIS — Z510 Encounter for antineoplastic radiation therapy: Secondary | ICD-10-CM | POA: Diagnosis not present

## 2022-01-25 DIAGNOSIS — C61 Malignant neoplasm of prostate: Secondary | ICD-10-CM | POA: Diagnosis not present

## 2022-01-25 LAB — RAD ONC ARIA SESSION SUMMARY
Course Elapsed Days: 27
Plan Fractions Treated to Date: 19
Plan Prescribed Dose Per Fraction: 1.8 Gy
Plan Total Fractions Prescribed: 25
Plan Total Prescribed Dose: 45 Gy
Reference Point Dosage Given to Date: 34.2 Gy
Reference Point Session Dosage Given: 1.8 Gy
Session Number: 19

## 2022-01-26 ENCOUNTER — Other Ambulatory Visit: Payer: Self-pay

## 2022-01-26 ENCOUNTER — Ambulatory Visit
Admission: RE | Admit: 2022-01-26 | Discharge: 2022-01-26 | Disposition: A | Discharge status: Home / Self Care | Payer: Medicare Other | Source: Ambulatory Visit | Attending: Radiation Oncology | Admitting: Radiation Oncology

## 2022-01-26 DIAGNOSIS — Z191 Hormone sensitive malignancy status: Secondary | ICD-10-CM | POA: Diagnosis not present

## 2022-01-26 DIAGNOSIS — Z510 Encounter for antineoplastic radiation therapy: Secondary | ICD-10-CM | POA: Diagnosis not present

## 2022-01-26 DIAGNOSIS — C61 Malignant neoplasm of prostate: Secondary | ICD-10-CM | POA: Diagnosis not present

## 2022-01-26 LAB — RAD ONC ARIA SESSION SUMMARY
Course Elapsed Days: 28
Plan Fractions Treated to Date: 20
Plan Prescribed Dose Per Fraction: 1.8 Gy
Plan Total Fractions Prescribed: 25
Plan Total Prescribed Dose: 45 Gy
Reference Point Dosage Given to Date: 36 Gy
Reference Point Session Dosage Given: 1.8 Gy
Session Number: 20

## 2022-01-27 ENCOUNTER — Ambulatory Visit
Admission: RE | Admit: 2022-01-27 | Discharge: 2022-01-27 | Disposition: A | Discharge status: Home / Self Care | Payer: Medicare Other | Source: Ambulatory Visit | Attending: Radiation Oncology | Admitting: Radiation Oncology

## 2022-01-27 ENCOUNTER — Other Ambulatory Visit: Payer: Self-pay

## 2022-01-27 DIAGNOSIS — Z191 Hormone sensitive malignancy status: Secondary | ICD-10-CM | POA: Diagnosis not present

## 2022-01-27 DIAGNOSIS — C61 Malignant neoplasm of prostate: Secondary | ICD-10-CM | POA: Diagnosis not present

## 2022-01-27 DIAGNOSIS — Z510 Encounter for antineoplastic radiation therapy: Secondary | ICD-10-CM | POA: Diagnosis not present

## 2022-01-27 LAB — RAD ONC ARIA SESSION SUMMARY
Course Elapsed Days: 29
Plan Fractions Treated to Date: 21
Plan Prescribed Dose Per Fraction: 1.8 Gy
Plan Total Fractions Prescribed: 25
Plan Total Prescribed Dose: 45 Gy
Reference Point Dosage Given to Date: 37.8 Gy
Reference Point Session Dosage Given: 1.8 Gy
Session Number: 21

## 2022-01-28 ENCOUNTER — Ambulatory Visit
Admission: RE | Admit: 2022-01-28 | Discharge: 2022-01-28 | Disposition: A | Discharge status: Home / Self Care | Payer: Medicare Other | Source: Ambulatory Visit | Attending: Radiation Oncology | Admitting: Radiation Oncology

## 2022-01-28 ENCOUNTER — Other Ambulatory Visit: Payer: Self-pay

## 2022-01-28 DIAGNOSIS — Z510 Encounter for antineoplastic radiation therapy: Secondary | ICD-10-CM | POA: Diagnosis not present

## 2022-01-28 DIAGNOSIS — Z191 Hormone sensitive malignancy status: Secondary | ICD-10-CM | POA: Diagnosis not present

## 2022-01-28 DIAGNOSIS — C61 Malignant neoplasm of prostate: Secondary | ICD-10-CM | POA: Diagnosis not present

## 2022-01-28 LAB — RAD ONC ARIA SESSION SUMMARY
Course Elapsed Days: 30
Plan Fractions Treated to Date: 22
Plan Prescribed Dose Per Fraction: 1.8 Gy
Plan Total Fractions Prescribed: 25
Plan Total Prescribed Dose: 45 Gy
Reference Point Dosage Given to Date: 39.6 Gy
Reference Point Session Dosage Given: 1.8 Gy
Session Number: 22

## 2022-01-29 ENCOUNTER — Other Ambulatory Visit: Payer: Self-pay

## 2022-01-29 ENCOUNTER — Ambulatory Visit
Admission: RE | Admit: 2022-01-29 | Discharge: 2022-01-29 | Disposition: A | Discharge status: Home / Self Care | Payer: Medicare Other | Source: Ambulatory Visit | Attending: Radiation Oncology | Admitting: Radiation Oncology

## 2022-01-29 DIAGNOSIS — Z191 Hormone sensitive malignancy status: Secondary | ICD-10-CM | POA: Diagnosis not present

## 2022-01-29 DIAGNOSIS — Z510 Encounter for antineoplastic radiation therapy: Secondary | ICD-10-CM | POA: Diagnosis not present

## 2022-01-29 DIAGNOSIS — C61 Malignant neoplasm of prostate: Secondary | ICD-10-CM | POA: Diagnosis not present

## 2022-01-29 LAB — RAD ONC ARIA SESSION SUMMARY
Course Elapsed Days: 31
Plan Fractions Treated to Date: 23
Plan Prescribed Dose Per Fraction: 1.8 Gy
Plan Total Fractions Prescribed: 25
Plan Total Prescribed Dose: 45 Gy
Reference Point Dosage Given to Date: 41.4 Gy
Reference Point Session Dosage Given: 1.8 Gy
Session Number: 23

## 2022-02-01 ENCOUNTER — Ambulatory Visit
Admission: RE | Admit: 2022-02-01 | Discharge: 2022-02-01 | Disposition: A | Discharge status: Home / Self Care | Payer: Medicare Other | Source: Ambulatory Visit | Attending: Radiation Oncology | Admitting: Radiation Oncology

## 2022-02-01 ENCOUNTER — Other Ambulatory Visit: Payer: Self-pay

## 2022-02-01 DIAGNOSIS — Z191 Hormone sensitive malignancy status: Secondary | ICD-10-CM | POA: Diagnosis not present

## 2022-02-01 DIAGNOSIS — C61 Malignant neoplasm of prostate: Secondary | ICD-10-CM | POA: Diagnosis not present

## 2022-02-01 DIAGNOSIS — Z510 Encounter for antineoplastic radiation therapy: Secondary | ICD-10-CM | POA: Diagnosis not present

## 2022-02-01 LAB — RAD ONC ARIA SESSION SUMMARY
Course Elapsed Days: 34
Plan Fractions Treated to Date: 24
Plan Prescribed Dose Per Fraction: 1.8 Gy
Plan Total Fractions Prescribed: 25
Plan Total Prescribed Dose: 45 Gy
Reference Point Dosage Given to Date: 43.2 Gy
Reference Point Session Dosage Given: 1.8 Gy
Session Number: 24

## 2022-02-02 ENCOUNTER — Other Ambulatory Visit: Payer: Self-pay

## 2022-02-02 ENCOUNTER — Encounter: Payer: Self-pay | Admitting: Urology

## 2022-02-02 ENCOUNTER — Ambulatory Visit
Admission: RE | Admit: 2022-02-02 | Discharge: 2022-02-02 | Disposition: A | Discharge status: Home / Self Care | Payer: Medicare Other | Source: Ambulatory Visit | Attending: Radiation Oncology | Admitting: Radiation Oncology

## 2022-02-02 DIAGNOSIS — C61 Malignant neoplasm of prostate: Secondary | ICD-10-CM | POA: Diagnosis not present

## 2022-02-02 DIAGNOSIS — Z191 Hormone sensitive malignancy status: Secondary | ICD-10-CM | POA: Diagnosis not present

## 2022-02-02 DIAGNOSIS — Z510 Encounter for antineoplastic radiation therapy: Secondary | ICD-10-CM | POA: Diagnosis not present

## 2022-02-02 LAB — RAD ONC ARIA SESSION SUMMARY
Course Elapsed Days: 35
Plan Fractions Treated to Date: 25
Plan Prescribed Dose Per Fraction: 1.8 Gy
Plan Total Fractions Prescribed: 25
Plan Total Prescribed Dose: 45 Gy
Reference Point Dosage Given to Date: 45 Gy
Reference Point Session Dosage Given: 1.8 Gy
Session Number: 25

## 2022-02-16 DIAGNOSIS — C61 Malignant neoplasm of prostate: Secondary | ICD-10-CM | POA: Diagnosis not present

## 2022-03-31 NOTE — Progress Notes (Signed)
                                                                                                                                                             Patient Name: Stephen Zavala MRN: [id number] DOB: [birth year] Referring Physician: [name] (Profile Not Attached) Date of Service: 02/02/2022 [facility], [place]                                                        End Of Treatment Note  Diagnoses: C61-Malignant neoplasm of prostate  Cancer Staging: 71 y.o. 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   Intent: Curative  Radiation Treatment Dates: 12/29/2021 through 02/02/2022 Site Technique Total Dose (Gy) Dose per Fx (Gy) Completed Fx Beam Energies  Prostate: Prostate_Pelvis IMRT 45/45 1.8 25/25 6X   12/01/21: Brachytherapy boost: Up-front prostate seed implant boost of 110 Gy to achieve a total nominal dose of 155 Gy.   Narrative: The patient tolerated radiation therapy relatively well. He did report some increased nocturia but this was manageable with Flomax daily as prescribed.  Plan: The patient will receive a call in about one month from the radiation oncology department. He will continue follow up with Dr. Claudia Desanctis as well.    Nicholos Johns, PA-C    [name], MD  [facility] Direct Dial: [phone]  Fax: [phone] [email or web address]  Skype  [email or web address]

## 2022-03-31 NOTE — Progress Notes (Signed)
  Radiation Oncology         [phone] ________________________________  Name: Stephen Zavala MRN: [id number]  Date of Service: 04/01/2022  DOB: 1950/10/06  Post Treatment Telephone Note  Diagnosis:  71 y.o. 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   Symptoms of fatigue have improved since completing therapy.  Symptoms of bladder changes have improved since completing therapy. Current symptoms include urinary urgency, and medications for bladder symptoms include Tamsulosin.  Symptoms of bowel changes have improved since completing therapy. Current symptoms include none, and medications for bowel symptoms include none.     Post Treatment IPSS Score: IPSS Questionnaire (AUA-7): Over the past month.   1)  How often have you had a sensation of not emptying your bladder completely after you finish urinating?  0 - Not at all  2)  How often have you had to urinate again less than two hours after you finished urinating? 1 - Less than 1 time in 5  3)  How often have you found you stopped and started again several times when you urinated?  0 - Not at all  4) How difficult have you found it to postpone urination?  0 - Not at all  5) How often have you had a weak urinary stream?  1 - Less than 1 time in 5  6) How often have you had to push or strain to begin urination?  0 - Not at all  7) How many times did you most typically get up to urinate from the time you went to bed until the time you got up in the morning?  3 - 3 times  Total score:  5. Which indicates mild symptoms  0-7 mildly symptomatic   8-19 moderately symptomatic   20-35 severely symptomatic    Patient does not have a scheduled follow up visit with his urologist, Dr. Claudia Desanctis to his knowledge, so he was advised to  call [facility] to schedule his post-treatment follow up for ongoing surveillance. He was counseled that PSA levels will be drawn in the urology office, and was reassured that additional time is expected to improve bowel and bladder symptoms. He was encouraged to call back with concerns or questions regarding radiation.  This concludes the nursing interview.  Leandra Kern, LPN

## 2022-04-01 ENCOUNTER — Ambulatory Visit
Admission: RE | Admit: 2022-04-01 | Discharge: 2022-04-01 | Disposition: A | Discharge status: Home / Self Care | Payer: Medicare Other | Source: Ambulatory Visit | Attending: Urology | Admitting: Urology

## 2022-04-01 DIAGNOSIS — C61 Malignant neoplasm of prostate: Secondary | ICD-10-CM

## 2022-05-27 ENCOUNTER — Encounter: Payer: Self-pay | Admitting: *Deleted

## 2022-05-27 NOTE — Progress Notes (Signed)
RN Navigator called [facility] to inquire when pt's next urology appt is. I talked to Nurse Mickel Baas who  said she will send Dr. Claudia Desanctis a message and then call the patient.

## 2022-05-28 ENCOUNTER — Encounter: Payer: Self-pay | Admitting: *Deleted

## 2022-05-28 NOTE — Progress Notes (Signed)
Called pt to inform him of Eligard injection and PSA labs on Jan.16 at 11:30a. Pt verbalized understanding and agreed to plan.

## 2022-06-01 DIAGNOSIS — Z5111 Encounter for antineoplastic chemotherapy: Secondary | ICD-10-CM | POA: Diagnosis not present

## 2022-06-01 DIAGNOSIS — C61 Malignant neoplasm of prostate: Secondary | ICD-10-CM | POA: Diagnosis not present

## 2022-06-08 ENCOUNTER — Inpatient Hospital Stay: Payer: Medicare Other | Attending: Adult Health | Admitting: *Deleted

## 2022-06-08 DIAGNOSIS — C61 Malignant neoplasm of prostate: Secondary | ICD-10-CM

## 2022-06-08 NOTE — Progress Notes (Signed)
2 Identifiers were used for verification purposes. No vital signs were taken as this was a telephone visit. Allergies and medications were reviewed and updated. Pt denies pain and fatigue. Pt does have minimal hotflashes and had some mood changes but he says those are much better. Pt is sleeping better as he says he is not getting up to the bathroom as much as in the beginning after treatment. Daytime urinary frequency and urgency is resolving. Pt says his urine flow is almost back to normal. No issues with bowels. Pt says he is now exercising 15-20 daily by pedaling on his stationery bike. Pt says exercise has attributed to his fatigue getting better. Pt is updated with vaccines and will get the RSV vaccine next week. Pt denies smoking and drinking. We discussed healthy eating as well and reviewed the [employer] handout. Overall pt feels as if he is doing very well. He was pleased with latest PSA numbers which was 0.17 on Jan.16,2024. Next appt with urologist is [phone]. SCP reviewed and completed.

## 2022-07-21 DIAGNOSIS — C61 Malignant neoplasm of prostate: Secondary | ICD-10-CM | POA: Diagnosis not present

## 2022-07-21 DIAGNOSIS — R351 Nocturia: Secondary | ICD-10-CM | POA: Diagnosis not present

## 2022-11-17 DIAGNOSIS — C61 Malignant neoplasm of prostate: Secondary | ICD-10-CM | POA: Diagnosis not present

## 2022-11-24 DIAGNOSIS — R351 Nocturia: Secondary | ICD-10-CM | POA: Diagnosis not present

## 2022-11-24 DIAGNOSIS — C61 Malignant neoplasm of prostate: Secondary | ICD-10-CM | POA: Diagnosis not present

## 2022-12-02 DIAGNOSIS — C61 Malignant neoplasm of prostate: Secondary | ICD-10-CM | POA: Diagnosis not present

## 2022-12-02 DIAGNOSIS — C7951 Secondary malignant neoplasm of bone: Secondary | ICD-10-CM | POA: Diagnosis not present

## 2022-12-02 DIAGNOSIS — Z5111 Encounter for antineoplastic chemotherapy: Secondary | ICD-10-CM | POA: Diagnosis not present

## 2022-12-10 IMAGING — MR MR ABDOMEN WO/W CM
12 of 19 series · 26 of 48 positions shown · IV contrast (multihance)
Comparison: Abdominopelvic CT 08/05/2021

CLINICAL DATA: Indeterminate liver lesions on abdominopelvic CT.
History of prostate cancer.

EXAM:
MRI ABDOMEN WITHOUT AND WITH CONTRAST
TECHNIQUE: Multiplanar multisequence MR imaging of the abdomen was performed
both before and after the administration of intravenous contrast.
CONTRAST:  17mL MULTIHANCE GADOBENATE DIMEGLUMINE 529 MG/ML IV SOLN

[Series 3: cor haste · coronal · 5.0mm · 0.68mm/px · 1 of 33 slices shown]
[im 1/33]
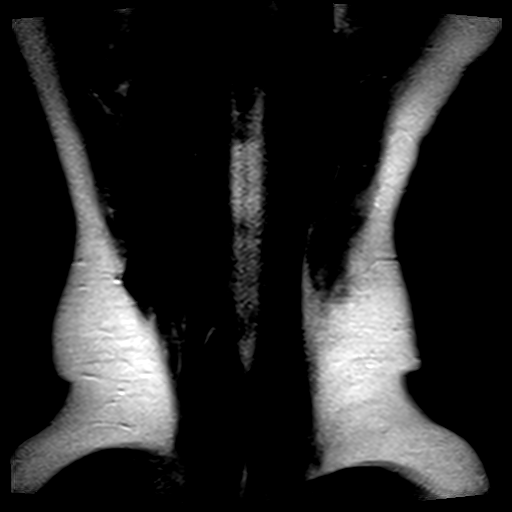

[Series 4: axial haste · axial · 6.0mm · 0.68mm/px · 1 of 35 slices shown]
[im 1/35]
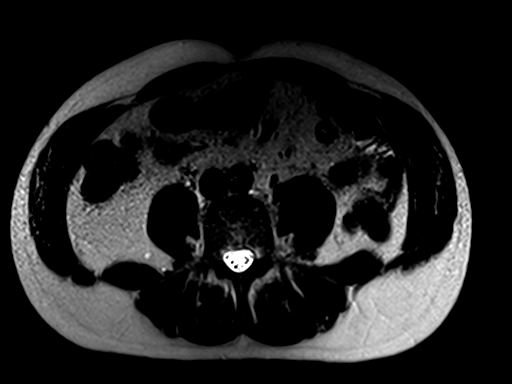

[Series 5: T1 · axial · 6.0mm · 0.68mm/px · z∈[-110,+102]mm · 2 of 66 slices shown]
[im 1/66]
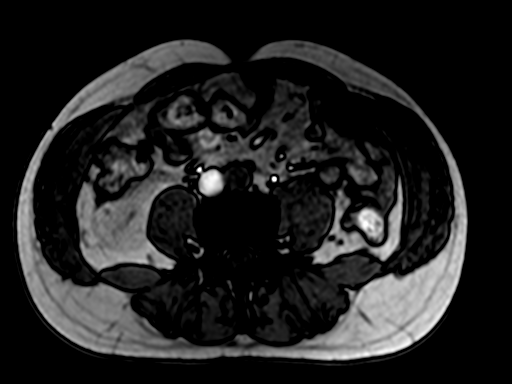
[im 66/66]
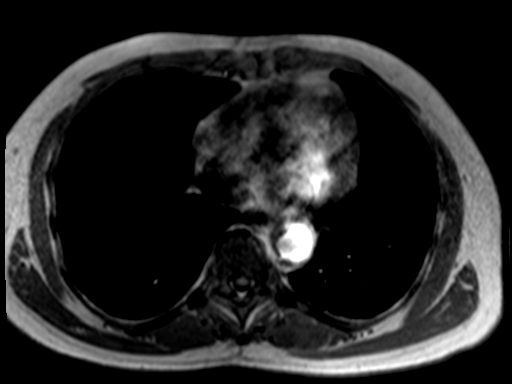

[Series 6: bSSFP · axial · 4.0mm · 0.68mm/px · z∈[-124,+116]mm · 2 of 61 slices shown]
[im 1/61]
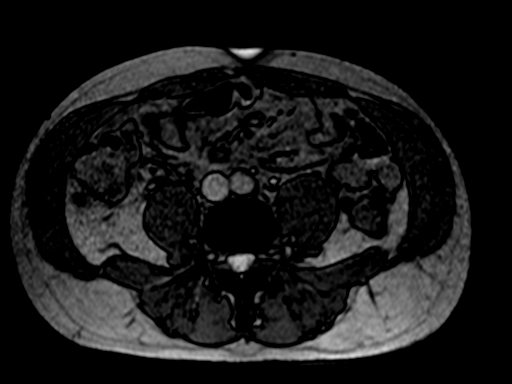
[im 61/61]
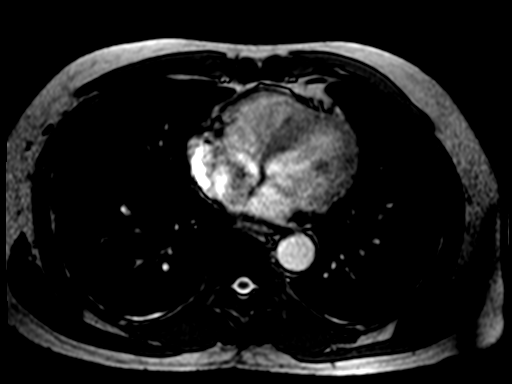

[Series 7: T2 fat-sat · axial · 6.0mm · 1.09mm/px · 1 of 35 slices shown]
[im 1/35]
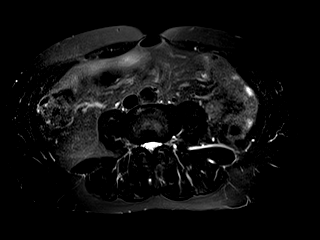

[Series 8: [id number]_trig · axial · 6.0mm · 1.82mm/px · z∈[-96,+149]mm · 4 of 105 slices shown]
[im 1/105]
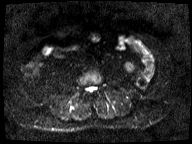
[im 35/105]
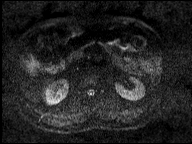
[im 70/105]
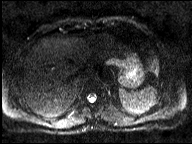
[im 105/105]
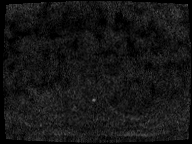

[Series 9: ep2d_[id number]_trig_adc · axial · 6.0mm · 1.82mm/px · 1 of 35 slices shown]
[im 1/35]
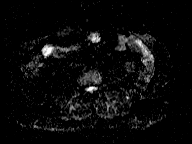

[Series 10: T1 dynamic · axial · non-contrast · 2.5mm · 0.74mm/px · z∈[-113,+104]mm · 3 of 88 slices shown]
[im 1/88]
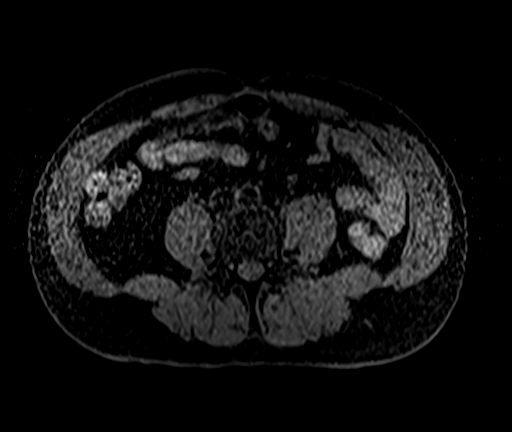
[im 44/88]
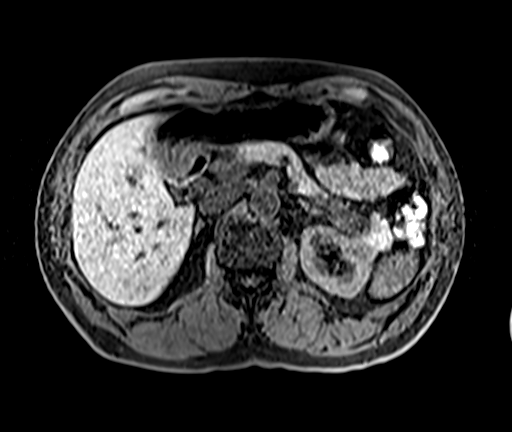
[im 88/88]
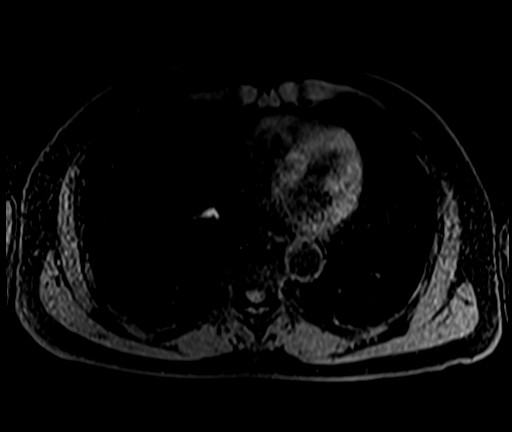

[Series 11: T1 dynamic post-contrast · axial · 2.5mm · 0.74mm/px · z∈[-113,+104]mm · 3 of 88 slices shown (1 of 4)]
[im 1/88]
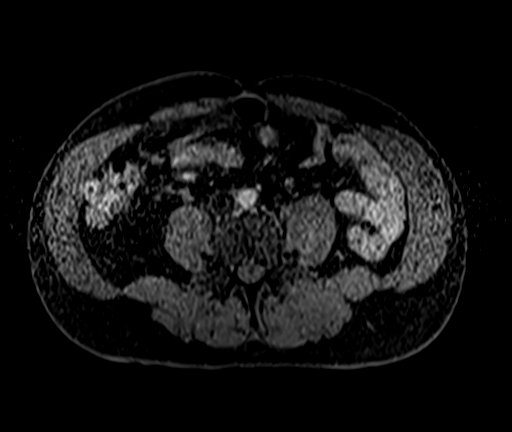
[im 44/88]
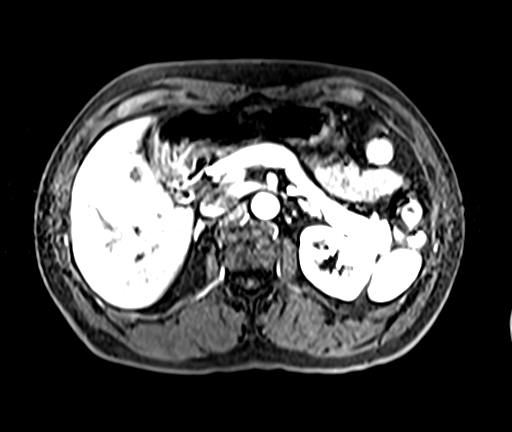
[im 88/88]
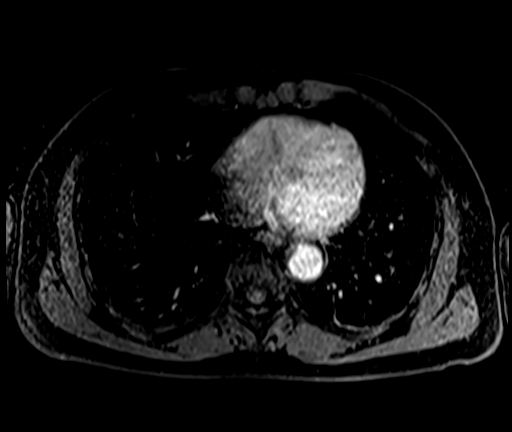

[Series 12: T1 dynamic post-contrast · axial · 2.5mm · 0.74mm/px · z∈[-113,+104]mm · 3 of 88 slices shown (2 of 4)]
[im 1/88]
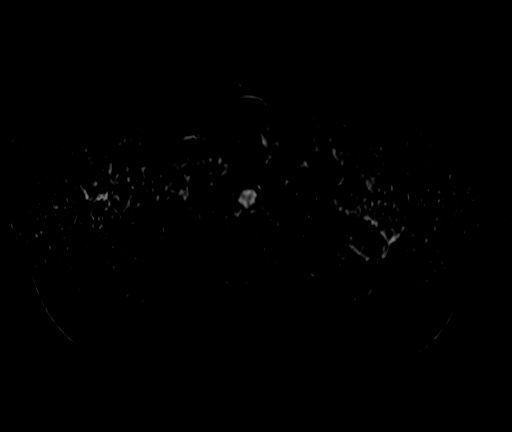
[im 44/88]
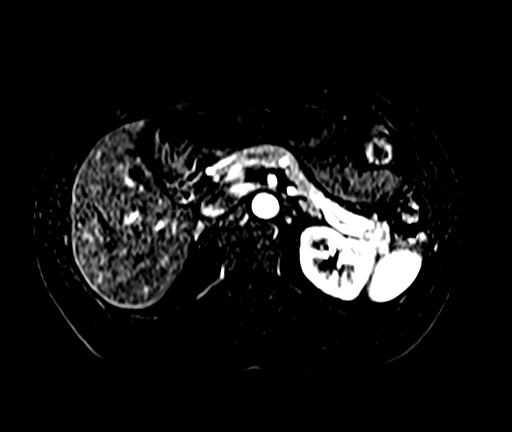
[im 88/88]
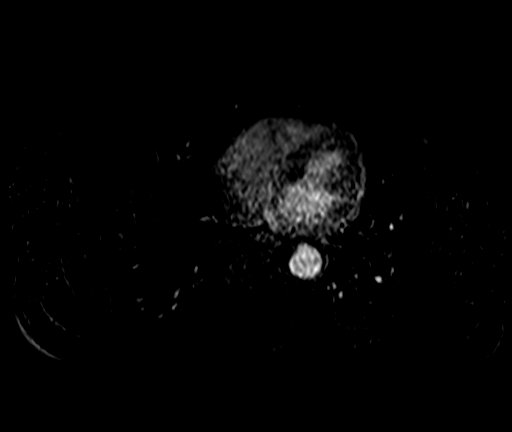

[Series 13: T1 dynamic post-contrast · axial · 2.5mm · 0.74mm/px · z∈[-113,+104]mm · 3 of 88 slices shown (3 of 4)]
[im 1/88]
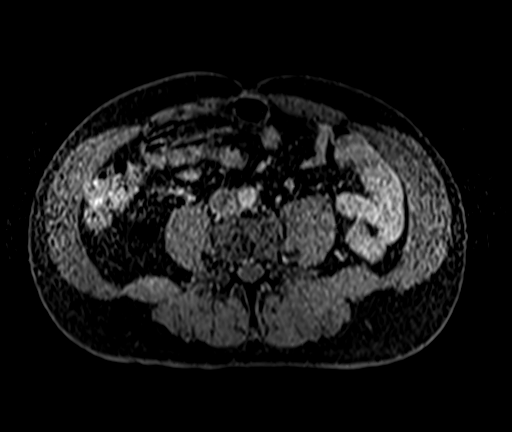
[im 44/88]
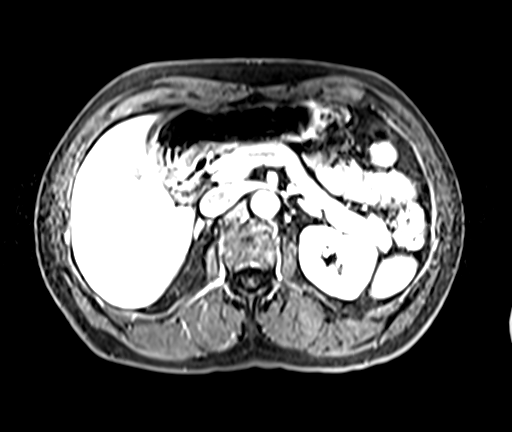
[im 88/88]
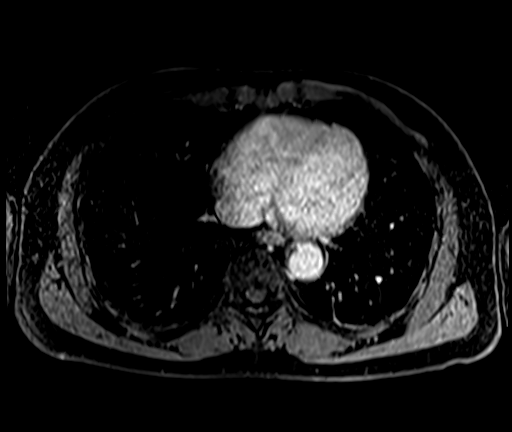

[Series 14: T1 dynamic post-contrast · axial · 2.5mm · 0.74mm/px · z∈[-113,-6]mm · 2 of 88 slices shown (4 of 4)]
[im 1/88]
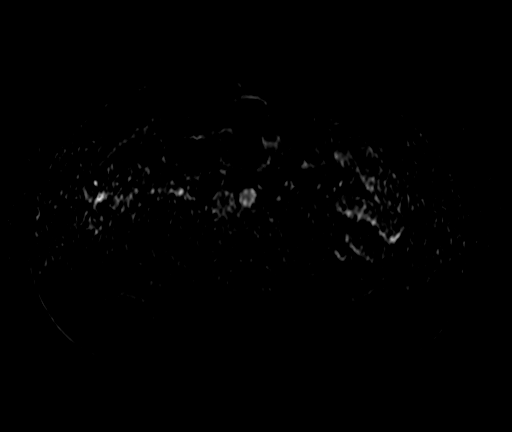
[im 44/88]
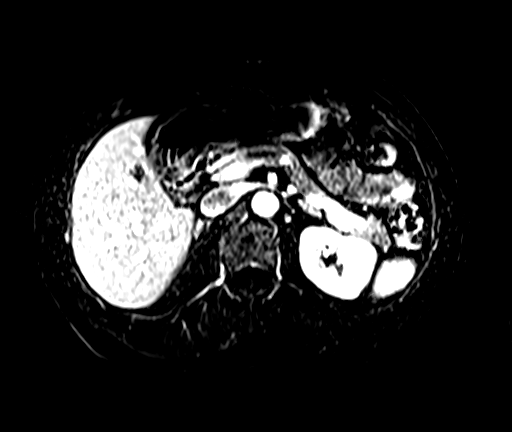

[26 of 48 positions shown; findings below may reference images not displayed]

FINDINGS: Lower chest:  The visualized lower chest appears unremarkable.

Hepatobiliary: The liver is normal in morphology without significant
steatosis. As seen on CT, there are several liver lesions, the
largest of which demonstrate typical features of hemangiomas.
Notably, these demonstrate T2 hyperintensity and peripheral
discontinuous enhancement following contrast which progresses on the
delayed images. Largest lesions measure 1.9 x 1.5 cm peripherally in
the left hepatic lobe on image [DATE] and inferiorly in the right lobe
measuring 1.5 x 1.2 cm on image [DATE]. There is another lesion
centrally in the dome of the right hepatic lobe which demonstrates
homogeneous arterial phase enhancement, measuring 1.4 x 1.3 cm on
image [DATE]. This demonstrates no T2 hyperintensity and is likely a
small focus of focal nodular hyperplasia. Tiny cysts are also
present. There are no suspicious liver lesions. No evidence of
gallstones, gallbladder wall thickening or biliary dilatation.

Pancreas: Probable pancreas divisum. No evidence of pancreatic mass,
pancreatic ductal dilatation or surrounding inflammation.

Spleen: Normal in size without focal abnormality.

Adrenals/Urinary Tract: Both adrenal glands appear normal. Tiny
renal cysts bilaterally for which no follow-is necessary. No
evidence of enhancing renal mass or hydronephrosis.

Stomach/Bowel:

The stomach appears unremarkable for its degree of distension. No
evidence of bowel wall thickening, distention or surrounding
inflammatory change.

Vascular/Lymphatic: There are no enlarged abdominal lymph nodes.
Aortic and branch vessel atherosclerosis without acute vascular
findings.

Other: No evidence of abdominal wall hernia or ascites.

Musculoskeletal: No acute or significant osseous findings.
IMPRESSION: 1. No suspicious hepatic lesions. The findings on previous CT are
due to a combination of hemangiomas, cysts and probable focal
nodular hyperplasia (all benign).
2. No evidence of metastatic prostate cancer.
3. Probable pancreas divisum.
4.  Aortic Atherosclerosis ([id number]).

## 2022-12-14 DIAGNOSIS — D649 Anemia, unspecified: Secondary | ICD-10-CM | POA: Diagnosis not present

## 2022-12-14 DIAGNOSIS — C61 Malignant neoplasm of prostate: Secondary | ICD-10-CM | POA: Diagnosis not present

## 2022-12-14 DIAGNOSIS — M5412 Radiculopathy, cervical region: Secondary | ICD-10-CM | POA: Diagnosis not present

## 2022-12-14 DIAGNOSIS — Z1331 Encounter for screening for depression: Secondary | ICD-10-CM | POA: Diagnosis not present

## 2022-12-14 DIAGNOSIS — F322 Major depressive disorder, single episode, severe without psychotic features: Secondary | ICD-10-CM | POA: Diagnosis not present

## 2022-12-14 DIAGNOSIS — E039 Hypothyroidism, unspecified: Secondary | ICD-10-CM | POA: Diagnosis not present

## 2022-12-14 DIAGNOSIS — E559 Vitamin D deficiency, unspecified: Secondary | ICD-10-CM | POA: Diagnosis not present

## 2022-12-14 DIAGNOSIS — Z0000 Encounter for general adult medical examination without abnormal findings: Secondary | ICD-10-CM | POA: Diagnosis not present

## 2022-12-14 DIAGNOSIS — R7303 Prediabetes: Secondary | ICD-10-CM | POA: Diagnosis not present

## 2022-12-14 DIAGNOSIS — E782 Mixed hyperlipidemia: Secondary | ICD-10-CM | POA: Diagnosis not present

## 2022-12-14 DIAGNOSIS — F419 Anxiety disorder, unspecified: Secondary | ICD-10-CM | POA: Diagnosis not present

## 2022-12-14 DIAGNOSIS — I10 Essential (primary) hypertension: Secondary | ICD-10-CM | POA: Diagnosis not present

## 2022-12-14 DIAGNOSIS — G4700 Insomnia, unspecified: Secondary | ICD-10-CM | POA: Diagnosis not present

## 2022-12-14 DIAGNOSIS — R5383 Other fatigue: Secondary | ICD-10-CM | POA: Diagnosis not present

## 2022-12-14 DIAGNOSIS — Z79899 Other long term (current) drug therapy: Secondary | ICD-10-CM | POA: Diagnosis not present

## 2022-12-18 DIAGNOSIS — Z1212 Encounter for screening for malignant neoplasm of rectum: Secondary | ICD-10-CM | POA: Diagnosis not present

## 2022-12-18 DIAGNOSIS — Z1211 Encounter for screening for malignant neoplasm of colon: Secondary | ICD-10-CM | POA: Diagnosis not present

## 2022-12-23 LAB — COLOGUARD: COLOGUARD: NEGATIVE

## 2022-12-23 LAB — EXTERNAL GENERIC LAB PROCEDURE: COLOGUARD: NEGATIVE

## 2023-01-24 DIAGNOSIS — D649 Anemia, unspecified: Secondary | ICD-10-CM | POA: Diagnosis not present

## 2023-06-06 DIAGNOSIS — C61 Malignant neoplasm of prostate: Secondary | ICD-10-CM | POA: Diagnosis not present

## 2023-07-11 DIAGNOSIS — C61 Malignant neoplasm of prostate: Secondary | ICD-10-CM | POA: Diagnosis not present

## 2023-07-11 DIAGNOSIS — R351 Nocturia: Secondary | ICD-10-CM | POA: Diagnosis not present

## 2023-11-07 DIAGNOSIS — C61 Malignant neoplasm of prostate: Secondary | ICD-10-CM | POA: Diagnosis not present

## 2024-02-02 DIAGNOSIS — R7303 Prediabetes: Secondary | ICD-10-CM | POA: Diagnosis not present

## 2024-02-02 DIAGNOSIS — I10 Essential (primary) hypertension: Secondary | ICD-10-CM | POA: Diagnosis not present

## 2024-02-02 DIAGNOSIS — F439 Reaction to severe stress, unspecified: Secondary | ICD-10-CM | POA: Diagnosis not present

## 2024-02-02 DIAGNOSIS — C61 Malignant neoplasm of prostate: Secondary | ICD-10-CM | POA: Diagnosis not present

## 2024-02-02 DIAGNOSIS — E782 Mixed hyperlipidemia: Secondary | ICD-10-CM | POA: Diagnosis not present

## 2024-02-02 DIAGNOSIS — E538 Deficiency of other specified B group vitamins: Secondary | ICD-10-CM | POA: Diagnosis not present

## 2024-02-02 DIAGNOSIS — F419 Anxiety disorder, unspecified: Secondary | ICD-10-CM | POA: Diagnosis not present

## 2024-02-02 DIAGNOSIS — G4700 Insomnia, unspecified: Secondary | ICD-10-CM | POA: Diagnosis not present

## 2024-02-02 DIAGNOSIS — E039 Hypothyroidism, unspecified: Secondary | ICD-10-CM | POA: Diagnosis not present

## 2024-02-02 DIAGNOSIS — E559 Vitamin D deficiency, unspecified: Secondary | ICD-10-CM | POA: Diagnosis not present

## 2024-02-02 DIAGNOSIS — F322 Major depressive disorder, single episode, severe without psychotic features: Secondary | ICD-10-CM | POA: Diagnosis not present

## 2024-02-02 DIAGNOSIS — Z79899 Other long term (current) drug therapy: Secondary | ICD-10-CM | POA: Diagnosis not present

## 2024-04-24 DIAGNOSIS — C61 Malignant neoplasm of prostate: Secondary | ICD-10-CM | POA: Diagnosis not present

## 2024-05-01 DIAGNOSIS — N401 Enlarged prostate with lower urinary tract symptoms: Secondary | ICD-10-CM | POA: Diagnosis not present

## 2024-05-01 DIAGNOSIS — Z8546 Personal history of malignant neoplasm of prostate: Secondary | ICD-10-CM | POA: Diagnosis not present
